# Patient Record
Sex: Female | Born: 1965
Health system: Southern US, Community
[De-identification: ages and names within clinical notes are randomized; demographics above are authoritative.]

## PROBLEM LIST (undated history)

## (undated) DIAGNOSIS — E559 Vitamin D deficiency, unspecified: Secondary | ICD-10-CM

## (undated) DIAGNOSIS — J45909 Unspecified asthma, uncomplicated: Secondary | ICD-10-CM

## (undated) DIAGNOSIS — T753XXA Motion sickness, initial encounter: Secondary | ICD-10-CM

## (undated) DIAGNOSIS — R062 Wheezing: Secondary | ICD-10-CM

## (undated) DIAGNOSIS — R51 Headache: Secondary | ICD-10-CM

## (undated) DIAGNOSIS — E2839 Other primary ovarian failure: Secondary | ICD-10-CM

## (undated) DIAGNOSIS — R519 Headache, unspecified: Secondary | ICD-10-CM

## (undated) DIAGNOSIS — E782 Mixed hyperlipidemia: Secondary | ICD-10-CM

## (undated) DIAGNOSIS — I10 Essential (primary) hypertension: Secondary | ICD-10-CM

## (undated) DIAGNOSIS — M1990 Unspecified osteoarthritis, unspecified site: Secondary | ICD-10-CM

## (undated) DIAGNOSIS — G4710 Hypersomnia, unspecified: Secondary | ICD-10-CM

## (undated) DIAGNOSIS — K5792 Diverticulitis of intestine, part unspecified, without perforation or abscess without bleeding: Secondary | ICD-10-CM

## (undated) DIAGNOSIS — J4530 Mild persistent asthma, uncomplicated: Secondary | ICD-10-CM

## (undated) HISTORY — PX: CHOLECYSTECTOMY: SHX55

## (undated) HISTORY — DX: Unspecified asthma, uncomplicated: J45.909

## (undated) HISTORY — DX: Wheezing: R06.2

## (undated) HISTORY — DX: Essential (primary) hypertension: I10

## (undated) HISTORY — DX: Mixed hyperlipidemia: E78.2

## (undated) HISTORY — DX: Hypersomnia, unspecified: G47.10

## (undated) HISTORY — DX: Other primary ovarian failure: E28.39

## (undated) HISTORY — DX: Mild persistent asthma, uncomplicated: J45.30

## (undated) HISTORY — DX: Diverticulitis of intestine, part unspecified, without perforation or abscess without bleeding: K57.92

## (undated) HISTORY — DX: Vitamin D deficiency, unspecified: E55.9

## (undated) HISTORY — PX: ABDOMINAL HYSTERECTOMY: SHX81

---

## 2004-07-28 ENCOUNTER — Ambulatory Visit: Payer: Self-pay | Admitting: Family Medicine

## 2005-12-28 ENCOUNTER — Ambulatory Visit: Payer: Self-pay | Admitting: Family Medicine

## 2006-04-17 ENCOUNTER — Ambulatory Visit: Payer: Self-pay | Admitting: Unknown Physician Specialty

## 2006-09-12 ENCOUNTER — Ambulatory Visit: Payer: Self-pay

## 2007-05-18 ENCOUNTER — Emergency Department: Payer: Self-pay | Admitting: Internal Medicine

## 2007-07-01 ENCOUNTER — Ambulatory Visit: Payer: Self-pay | Admitting: Neurosurgery

## 2007-10-01 ENCOUNTER — Ambulatory Visit: Payer: Self-pay | Admitting: Internal Medicine

## 2008-10-01 ENCOUNTER — Ambulatory Visit: Payer: Self-pay | Admitting: Internal Medicine

## 2009-07-14 ENCOUNTER — Ambulatory Visit: Payer: Self-pay | Admitting: Internal Medicine

## 2009-10-05 ENCOUNTER — Ambulatory Visit: Payer: Self-pay | Admitting: Internal Medicine

## 2010-10-06 ENCOUNTER — Ambulatory Visit: Payer: Self-pay

## 2011-10-06 ENCOUNTER — Ambulatory Visit: Payer: Self-pay | Admitting: Internal Medicine

## 2011-10-30 ENCOUNTER — Ambulatory Visit: Payer: Self-pay

## 2011-12-24 ENCOUNTER — Emergency Department: Payer: Self-pay | Admitting: Emergency Medicine

## 2011-12-24 LAB — BASIC METABOLIC PANEL
Anion Gap: 13 (ref 7–16)
BUN: 8 mg/dL (ref 7–18)
Chloride: 103 mmol/L (ref 98–107)
Creatinine: 0.58 mg/dL — ABNORMAL LOW (ref 0.60–1.30)
EGFR (African American): >60
EGFR (Non-African Amer.): >60
Glucose: 86 mg/dL (ref 65–99)
Osmolality: 281 (ref 275–301)
Potassium: 3.6 mmol/L (ref 3.5–5.1)
Sodium: 142 mmol/L (ref 136–145)

## 2011-12-24 LAB — CBC
HCT: 39.5 % (ref 35.0–47.0)
HGB: 13.2 g/dL (ref 12.0–16.0)
MCH: 30.1 pg (ref 26.0–34.0)
MCHC: 33.3 g/dL (ref 32.0–36.0)
Platelet: 345 10*3/uL (ref 150–440)
RDW: 13.9 % (ref 11.5–14.5)

## 2011-12-24 LAB — TROPONIN I: Troponin-I: <0.02 ng/mL

## 2012-01-03 ENCOUNTER — Ambulatory Visit: Payer: Self-pay | Admitting: Internal Medicine

## 2012-01-04 ENCOUNTER — Ambulatory Visit: Payer: Self-pay | Admitting: Internal Medicine

## 2012-05-25 ENCOUNTER — Ambulatory Visit: Payer: Self-pay | Admitting: Internal Medicine

## 2012-10-25 ENCOUNTER — Ambulatory Visit: Payer: Self-pay | Admitting: Physician Assistant

## 2012-10-30 ENCOUNTER — Ambulatory Visit: Payer: Self-pay

## 2012-11-04 ENCOUNTER — Ambulatory Visit: Payer: Self-pay | Admitting: Internal Medicine

## 2013-10-31 ENCOUNTER — Ambulatory Visit: Payer: Self-pay

## 2013-11-07 ENCOUNTER — Ambulatory Visit: Payer: Self-pay

## 2014-04-13 ENCOUNTER — Ambulatory Visit: Payer: Self-pay

## 2014-05-08 ENCOUNTER — Ambulatory Visit: Payer: Self-pay

## 2014-11-05 ENCOUNTER — Ambulatory Visit: Payer: Self-pay

## 2015-11-19 ENCOUNTER — Other Ambulatory Visit: Payer: Self-pay

## 2015-11-19 ENCOUNTER — Telehealth: Payer: Self-pay

## 2015-11-19 ENCOUNTER — Telehealth: Payer: Self-pay | Admitting: Gastroenterology

## 2015-11-19 NOTE — Telephone Encounter (Signed)
LVM for pt to return my call.

## 2015-11-19 NOTE — Telephone Encounter (Signed)
colonoscopy

## 2015-11-19 NOTE — Telephone Encounter (Signed)
Pt scheduled for screening colonoscopy at [facility] on 12/10/15. Instructs/rx mailed. Pt has [employer]. Please get prior auth.

## 2015-11-19 NOTE — Telephone Encounter (Signed)
Gastroenterology Pre-Procedure Review  Request Date: 12/10/15 Requesting Physician: Dr. Beverely Risen   PATIENT REVIEW QUESTIONS: The patient responded to the following health history questions as indicated:    1. Are you having any GI issues? no 2. Do you have a personal history of Polyps? no 3. Do you have a family history of Colon Cancer or Polyps? no 4. Diabetes Mellitus? no 5. Joint replacements in the past 12 months?no 6. Major health problems in the past 3 months?no 7. Any artificial heart valves, MVP, or defibrillator?no    MEDICATIONS & ALLERGIES:    Patient reports the following regarding taking any anticoagulation/antiplatelet therapy:   Plavix, Coumadin, Eliquis, Xarelto, Lovenox, Pradaxa, Brilinta, or Effient? no Aspirin? yes (ASA )  Patient confirms/reports the following medications:  No current outpatient prescriptions on file.   No current facility-administered medications for this visit.    Patient confirms/reports the following allergies:  Allergies not on file  No orders of the defined types were placed in this encounter.    AUTHORIZATION INFORMATION Primary Insurance: 1D#: Group #:  Secondary Insurance: 1D#: Group #:  SCHEDULE INFORMATION: Date: 12/10/15 Time: Location: [facility]

## 2015-12-07 NOTE — Discharge Instructions (Signed)

## 2015-12-10 ENCOUNTER — Ambulatory Visit: Payer: 59 | Admitting: Anesthesiology

## 2015-12-10 ENCOUNTER — Ambulatory Visit
Admission: RE | Admit: 2015-12-10 | Discharge: 2015-12-10 | Disposition: A | Discharge status: Home / Self Care | Payer: 59 | Source: Ambulatory Visit | Attending: Gastroenterology | Admitting: Gastroenterology

## 2015-12-10 ENCOUNTER — Encounter: Payer: Self-pay | Admitting: Anesthesiology

## 2015-12-10 ENCOUNTER — Encounter
Admission: RE | Disposition: A | Discharge status: Home / Self Care | Payer: Self-pay | Source: Ambulatory Visit | Attending: Gastroenterology

## 2015-12-10 DIAGNOSIS — M1990 Unspecified osteoarthritis, unspecified site: Secondary | ICD-10-CM | POA: Insufficient documentation

## 2015-12-10 DIAGNOSIS — Z6841 Body Mass Index (BMI) 40.0 and over, adult: Secondary | ICD-10-CM | POA: Insufficient documentation

## 2015-12-10 DIAGNOSIS — Z0001 Encounter for general adult medical examination with abnormal findings: Secondary | ICD-10-CM | POA: Insufficient documentation

## 2015-12-10 DIAGNOSIS — K5730 Diverticulosis of large intestine without perforation or abscess without bleeding: Secondary | ICD-10-CM | POA: Insufficient documentation

## 2015-12-10 DIAGNOSIS — R51 Headache: Secondary | ICD-10-CM | POA: Diagnosis not present

## 2015-12-10 DIAGNOSIS — K640 First degree hemorrhoids: Secondary | ICD-10-CM | POA: Diagnosis not present

## 2015-12-10 DIAGNOSIS — Z79899 Other long term (current) drug therapy: Secondary | ICD-10-CM | POA: Insufficient documentation

## 2015-12-10 DIAGNOSIS — Z1239 Encounter for other screening for malignant neoplasm of breast: Secondary | ICD-10-CM

## 2015-12-10 DIAGNOSIS — Z1211 Encounter for screening for malignant neoplasm of colon: Secondary | ICD-10-CM

## 2015-12-10 DIAGNOSIS — Z7951 Long term (current) use of inhaled steroids: Secondary | ICD-10-CM | POA: Insufficient documentation

## 2015-12-10 DIAGNOSIS — J45909 Unspecified asthma, uncomplicated: Secondary | ICD-10-CM | POA: Insufficient documentation

## 2015-12-10 HISTORY — DX: Motion sickness, initial encounter: T75.3XXA

## 2015-12-10 HISTORY — DX: Unspecified osteoarthritis, unspecified site: M19.90

## 2015-12-10 HISTORY — PX: COLONOSCOPY WITH PROPOFOL: SHX5780

## 2015-12-10 HISTORY — DX: Headache, unspecified: R51.9

## 2015-12-10 HISTORY — DX: Headache: R51

## 2015-12-10 SURGERY — COLONOSCOPY WITH PROPOFOL
Anesthesia: Monitor Anesthesia Care

## 2015-12-10 MED ORDER — OXYCODONE HCL 5 MG/5ML PO SOLN: 5.0000 mg | Freq: Once | ORAL | Status: DC | PRN

## 2015-12-10 MED ORDER — LIDOCAINE HCL (CARDIAC) 20 MG/ML IV SOLN
INTRAVENOUS | Status: DC | PRN
  Administered 2015-12-10: 10 mg via INTRAVENOUS

## 2015-12-10 MED ORDER — OXYCODONE HCL 5 MG PO TABS: 5.0000 mg | ORAL_TABLET | Freq: Once | ORAL | Status: DC | PRN

## 2015-12-10 MED ORDER — STERILE WATER FOR IRRIGATION IR SOLN
Status: DC | PRN
  Administered 2015-12-10: 09:00:00

## 2015-12-10 MED ORDER — PROPOFOL 10 MG/ML IV BOLUS
INTRAVENOUS | Status: DC | PRN
  Administered 2015-12-10 (×8): 20 mg via INTRAVENOUS

## 2015-12-10 MED ORDER — LACTATED RINGERS IV SOLN
INTRAVENOUS | Status: DC
  Administered 2015-12-10: 09:00:00 via INTRAVENOUS

## 2015-12-10 SURGICAL SUPPLY — 28 items

## 2015-12-10 NOTE — Anesthesia Preprocedure Evaluation (Signed)
Anesthesia Evaluation  Patient identified by MRN, date of birth, ID band  Reviewed: NPO status   History of Anesthesia Complications Negative for: history of anesthetic complications  Airway Mallampati: II  TM Distance: >3 FB Neck ROM: full    Dental  (+) Lower Dentures,    Pulmonary asthma ,    Pulmonary exam normal        Cardiovascular Exercise Tolerance: Good negative cardio ROS Normal cardiovascular exam     Neuro/Psych  Headaches, negative psych ROS   GI/Hepatic negative GI ROS, Neg liver ROS,   Endo/Other  Morbid obesity  Renal/GU negative Renal ROS  negative genitourinary   Musculoskeletal  (+) Arthritis ,   Abdominal   Peds  Hematology negative hematology ROS (+)   Anesthesia Other Findings   Reproductive/Obstetrics                             Anesthesia Physical Anesthesia Plan  ASA: II  Anesthesia Plan: MAC   Post-op Pain Management:    Induction:   Airway Management Planned:   Additional Equipment:   Intra-op Plan:   Post-operative Plan:   Informed Consent: I have reviewed the patients History and Physical, chart, labs and discussed the procedure including the risks, benefits and alternatives for the proposed anesthesia with the patient or authorized representative who has indicated his/her understanding and acceptance.     Plan Discussed with: CRNA  Anesthesia Plan Comments:         Anesthesia Quick Evaluation

## 2015-12-10 NOTE — Op Note (Signed)
[facility] Gastroenterology Patient Name: Victoria Castaneda Procedure Date: 12/10/2015 8:48 AM MRN: [id number] Account #: [id number] Date of Birth: [birth year] Admit Type: Outpatient Age: 50 Room: [facility] OR ROOM 01 Gender: Female Note Status: Finalized Procedure:            Colonoscopy Indications:          Screening for colorectal malignant neoplasm Providers:            Midge Minium, MD Referring MD:         Lyndon Code, MD (Referring MD) Medicines:            Propofol per Anesthesia Complications:        No immediate complications. Procedure:            Pre-Anesthesia Assessment:                       - Prior to the procedure, a History and Physical was                        performed, and patient medications and allergies were                        reviewed. The patient's tolerance of previous                        anesthesia was also reviewed. The risks and benefits of                        the procedure and the sedation options and risks were                        discussed with the patient. All questions were                        answered, and informed consent was obtained. Prior                        Anticoagulants: The patient has taken no previous                        anticoagulant or antiplatelet agents. ASA Grade                        Assessment: II - A patient with mild systemic disease.                        After reviewing the risks and benefits, the patient was                        deemed in satisfactory condition to undergo the                        procedure.                       After obtaining informed consent, the colonoscope was                        passed under direct vision. Throughout the procedure,  the patient's blood pressure, pulse, and oxygen                        saturations were monitored continuously. The Olympus                        CF-HQ190L Colonoscope (S#. [id number]) was introduced             through the anus and advanced to the the cecum,                        identified by appendiceal orifice and ileocecal valve.                        The colonoscopy was performed without difficulty. The                        patient tolerated the procedure well. The quality of                        the bowel preparation was excellent. Findings:      The perianal and digital rectal examinations were normal.      Non-bleeding internal hemorrhoids were found during retroflexion. The       hemorrhoids were Grade I (internal hemorrhoids that do not prolapse).      Multiple small-mouthed diverticula were found in the sigmoid colon. Impression:           - Non-bleeding internal hemorrhoids.                       - Diverticulosis in the sigmoid colon.                       - No specimens collected. Recommendation:       - Repeat colonoscopy in 10 years for screening unless                        any change in family history or lower GI problems. Procedure Code(s):    --- Professional ---                       [phone], Colonoscopy, flexible; diagnostic, including                        collection of specimen(s) by brushing or washing, when                        performed (separate procedure) Diagnosis Code(s):    --- Professional ---                       Z12.11, Encounter for screening for malignant neoplasm                        of colon CPT copyright 2016 American Medical Association. All rights reserved. The codes documented in this report are preliminary and upon coder review may  be revised to meet current compliance requirements. Midge Minium, MD 12/10/2015 9:10:17 AM This report has been signed electronically. Number of Addenda: 0 Note Initiated On: 12/10/2015 8:48 AM Scope Withdrawal Time: 0 hours 8 minutes 22 seconds  Total Procedure Duration: 0 hours  11 minutes 29 seconds       [facility]

## 2015-12-10 NOTE — Anesthesia Postprocedure Evaluation (Signed)
Anesthesia Post Note  Patient: Victoria SartoriusSheila Castaneda  Procedure(s) Performed: Procedure(s) (LRB): COLONOSCOPY WITH PROPOFOL (N/A)  Patient location during evaluation: PACU Anesthesia Type: MAC Level of consciousness: awake and alert Pain management: pain level controlled Vital Signs Assessment: post-procedure vital signs reviewed and stable Respiratory status: spontaneous breathing, nonlabored ventilation, respiratory function stable and patient connected to nasal cannula oxygen Cardiovascular status: stable and blood pressure returned to baseline Anesthetic complications: no    Susane Bey

## 2015-12-10 NOTE — H&P (Signed)
  [facility]  [address] [place], [place] [address] Phone: [phone] Fax : [phone]  Primary Care Physician:  Lyndon CodeKHAN, FOZIA M, MD Primary Gastroenterologist:  Dr. Servando SnareWohl  Pre-Procedure History & Physical: HPI:  Victoria SartoriusSheila Castaneda is a 50 y.o. female is here for a screening colonoscopy.   Past Medical History  Diagnosis Date  . Motion sickness   . Headache   . Arthritis     History reviewed. No pertinent past surgical history.  Prior to Admission medications   Medication Sig Start Date End Date Taking? Authorizing Provider  fluticasone (FLONASE) 50 MCG/ACT nasal spray Place into both nostrils daily.    Historical Provider, MD  Fluticasone-Salmeterol (ADVAIR) 250-50 MCG/DOSE AEPB Inhale 1 puff into the lungs 2 (two) times daily.    Historical Provider, MD  furosemide (LASIX) 20 MG tablet Take 20 mg by mouth.    Historical Provider, MD    Allergies as of 11/19/2015  . (No Known Allergies)    History reviewed. No pertinent family history.  Social History   Social History  . Marital Status: Single    Spouse Name: N/A  . Number of Children: N/A  . Years of Education: N/A   Occupational History  . Not on file.   Social History Main Topics  . Smoking status: Never Smoker   . Smokeless tobacco: Not on file  . Alcohol Use: No  . Drug Use: No  . Sexual Activity: Not on file   Other Topics Concern  . Not on file   Social History Narrative  . No narrative on file    Review of Systems: See HPI, otherwise negative ROS  Physical Exam: Ht 5\' 4"  (1.626 m)  Wt 272 lb (123.378 kg)  BMI 46.67 kg/m2 General:   Alert,  pleasant and cooperative in NAD Head:  Normocephalic and atraumatic. Neck:  Supple; no masses or thyromegaly. Lungs:  Clear throughout to auscultation.    Heart:  Regular rate and rhythm. Abdomen:  Soft, nontender and nondistended. Normal bowel sounds, without guarding, and without rebound.   Neurologic:  Alert and  oriented x4;   grossly normal neurologically.  Impression/Plan: Victoria Castaneda is now here to undergo a screening colonoscopy.  Risks, benefits, and alternatives regarding colonoscopy have been reviewed with the patient.  Questions have been answered.  All parties agreeable.

## 2015-12-10 NOTE — Anesthesia Procedure Notes (Signed)
Procedure Name: MAC Performed by: Octavian Godek Pre-anesthesia Checklist: Patient identified, Emergency Drugs available, Suction available, Timeout performed and Patient being monitored Patient Re-evaluated:Patient Re-evaluated prior to inductionOxygen Delivery Method: Nasal cannula Placement Confirmation: positive ETCO2     

## 2015-12-10 NOTE — Transfer of Care (Signed)
Immediate Anesthesia Transfer of Care Note  Patient: Victoria SartoriusSheila Castaneda  Procedure(s) Performed: Procedure(s): COLONOSCOPY WITH PROPOFOL (N/A)  Patient Location: PACU  Anesthesia Type: MAC  Level of Consciousness: awake, alert  and patient cooperative  Airway and Oxygen Therapy: Patient Spontanous Breathing and Patient connected to supplemental oxygen  Post-op Assessment: Post-op Vital signs reviewed, Patient's Cardiovascular Status Stable, Respiratory Function Stable, Patent Airway and No signs of Nausea or vomiting  Post-op Vital Signs: Reviewed and stable  Complications: No apparent anesthesia complications

## 2015-12-13 ENCOUNTER — Encounter: Payer: Self-pay | Admitting: Gastroenterology

## 2016-11-14 DIAGNOSIS — L659 Nonscarring hair loss, unspecified: Secondary | ICD-10-CM | POA: Diagnosis not present

## 2016-11-14 DIAGNOSIS — L638 Other alopecia areata: Secondary | ICD-10-CM | POA: Diagnosis not present

## 2017-01-08 ENCOUNTER — Other Ambulatory Visit: Payer: Self-pay | Admitting: Nurse Practitioner

## 2017-01-08 DIAGNOSIS — Z0001 Encounter for general adult medical examination with abnormal findings: Secondary | ICD-10-CM | POA: Diagnosis not present

## 2017-01-08 DIAGNOSIS — I10 Essential (primary) hypertension: Secondary | ICD-10-CM | POA: Diagnosis not present

## 2017-01-08 DIAGNOSIS — Z1231 Encounter for screening mammogram for malignant neoplasm of breast: Secondary | ICD-10-CM

## 2017-01-08 DIAGNOSIS — N390 Urinary tract infection, site not specified: Secondary | ICD-10-CM | POA: Diagnosis not present

## 2017-02-01 ENCOUNTER — Ambulatory Visit
Admission: RE | Admit: 2017-02-01 | Discharge: 2017-02-01 | Disposition: A | Discharge status: Home / Self Care | Payer: 59 | Source: Ambulatory Visit | Attending: Nurse Practitioner | Admitting: Nurse Practitioner

## 2017-02-01 DIAGNOSIS — Z1231 Encounter for screening mammogram for malignant neoplasm of breast: Secondary | ICD-10-CM | POA: Insufficient documentation

## 2017-02-01 DIAGNOSIS — Z0001 Encounter for general adult medical examination with abnormal findings: Secondary | ICD-10-CM | POA: Diagnosis not present

## 2017-02-01 DIAGNOSIS — I10 Essential (primary) hypertension: Secondary | ICD-10-CM | POA: Diagnosis not present

## 2017-02-01 DIAGNOSIS — E559 Vitamin D deficiency, unspecified: Secondary | ICD-10-CM | POA: Diagnosis not present

## 2017-02-02 ENCOUNTER — Inpatient Hospital Stay
Admission: RE | Admit: 2017-02-02 | Discharge: 2017-02-02 | Disposition: A | Discharge status: Home / Self Care | Payer: Self-pay | Source: Ambulatory Visit | Attending: *Deleted | Admitting: *Deleted

## 2017-02-02 ENCOUNTER — Other Ambulatory Visit: Payer: Self-pay | Admitting: *Deleted

## 2017-02-02 DIAGNOSIS — Z9289 Personal history of other medical treatment: Secondary | ICD-10-CM

## 2017-02-13 DIAGNOSIS — G4710 Hypersomnia, unspecified: Secondary | ICD-10-CM | POA: Diagnosis not present

## 2017-02-13 DIAGNOSIS — J4530 Mild persistent asthma, uncomplicated: Secondary | ICD-10-CM | POA: Diagnosis not present

## 2017-03-16 DIAGNOSIS — N390 Urinary tract infection, site not specified: Secondary | ICD-10-CM | POA: Diagnosis not present

## 2017-03-18 ENCOUNTER — Emergency Department
Admission: EM | Admit: 2017-03-18 | Discharge: 2017-03-18 | Disposition: A | Discharge status: Home / Self Care | Payer: 59 | Attending: Emergency Medicine | Admitting: Emergency Medicine

## 2017-03-18 ENCOUNTER — Emergency Department: Payer: 59

## 2017-03-18 ENCOUNTER — Encounter: Payer: Self-pay | Admitting: Emergency Medicine

## 2017-03-18 DIAGNOSIS — R319 Hematuria, unspecified: Secondary | ICD-10-CM | POA: Diagnosis not present

## 2017-03-18 DIAGNOSIS — N390 Urinary tract infection, site not specified: Secondary | ICD-10-CM | POA: Insufficient documentation

## 2017-03-18 DIAGNOSIS — K449 Diaphragmatic hernia without obstruction or gangrene: Secondary | ICD-10-CM | POA: Diagnosis not present

## 2017-03-18 DIAGNOSIS — J45909 Unspecified asthma, uncomplicated: Secondary | ICD-10-CM | POA: Insufficient documentation

## 2017-03-18 DIAGNOSIS — K5732 Diverticulitis of large intestine without perforation or abscess without bleeding: Secondary | ICD-10-CM

## 2017-03-18 DIAGNOSIS — R1030 Lower abdominal pain, unspecified: Secondary | ICD-10-CM | POA: Diagnosis present

## 2017-03-18 HISTORY — DX: Unspecified asthma, uncomplicated: J45.909

## 2017-03-18 LAB — COMPREHENSIVE METABOLIC PANEL
ALK PHOS: 48 U/L (ref 38–126)
ALT: 11 U/L — ABNORMAL LOW (ref 14–54)
ANION GAP: 8 (ref 5–15)
AST: 14 U/L — ABNORMAL LOW (ref 15–41)
Albumin: 3.7 g/dL (ref 3.5–5.0)
BUN: 7 mg/dL (ref 6–20)
CALCIUM: 9.3 mg/dL (ref 8.9–10.3)
CO2: 30 mmol/L (ref 22–32)
Chloride: 103 mmol/L (ref 101–111)
Creatinine, Ser: 0.67 mg/dL (ref 0.44–1.00)
Glucose, Bld: 106 mg/dL — ABNORMAL HIGH (ref 65–99)
Potassium: 3.3 mmol/L — ABNORMAL LOW (ref 3.5–5.1)
SODIUM: 141 mmol/L (ref 135–145)
Total Bilirubin: 0.5 mg/dL (ref 0.3–1.2)
Total Protein: 8.7 g/dL — ABNORMAL HIGH (ref 6.5–8.1)

## 2017-03-18 LAB — URINALYSIS, COMPLETE (UACMP) WITH MICROSCOPIC
BILIRUBIN URINE: NEGATIVE
GLUCOSE, UA: NEGATIVE mg/dL
KETONES UR: NEGATIVE mg/dL
NITRITE: NEGATIVE
PH: 5 (ref 5.0–8.0)
PROTEIN: 30 mg/dL — AB
Specific Gravity, Urine: 1.02 (ref 1.005–1.030)

## 2017-03-18 LAB — LIPASE, BLOOD: LIPASE: 21 U/L (ref 11–51)

## 2017-03-18 LAB — CBC
HCT: 37.6 % (ref 35.0–47.0)
HEMOGLOBIN: 12.9 g/dL (ref 12.0–16.0)
MCH: 28.9 pg (ref 26.0–34.0)
MCHC: 34.3 g/dL (ref 32.0–36.0)
MCV: 84.4 fL (ref 80.0–100.0)
Platelets: 380 10*3/uL (ref 150–440)
RBC: 4.46 MIL/uL (ref 3.80–5.20)
RDW: 13.4 % (ref 11.5–14.5)
WBC: 13.1 10*3/uL — AB (ref 3.6–11.0)

## 2017-03-18 MED ORDER — CIPROFLOXACIN HCL 500 MG PO TABS: 500.0000 mg | ORAL_TABLET | Freq: Two times a day (BID) | ORAL | 0 refills | Status: DC

## 2017-03-18 MED ORDER — MORPHINE SULFATE (PF) 4 MG/ML IV SOLN
4.0000 mg | Freq: Once | INTRAVENOUS | Status: DC
  Filled 2017-03-18: qty 1

## 2017-03-18 MED ORDER — CIPROFLOXACIN IN D5W 400 MG/200ML IV SOLN
400.0000 mg | Freq: Once | INTRAVENOUS | Status: AC
  Administered 2017-03-18: 400 mg via INTRAVENOUS
  Filled 2017-03-18: qty 200

## 2017-03-18 MED ORDER — ACETAMINOPHEN 500 MG PO TABS
1000.0000 mg | ORAL_TABLET | Freq: Once | ORAL | Status: AC
  Administered 2017-03-18: 1000 mg via ORAL

## 2017-03-18 MED ORDER — METRONIDAZOLE 500 MG PO TABS: 500.0000 mg | ORAL_TABLET | Freq: Two times a day (BID) | ORAL | 0 refills | Status: DC

## 2017-03-18 MED ORDER — IOPAMIDOL (ISOVUE-300) INJECTION 61%
125.0000 mL | Freq: Once | INTRAVENOUS | Status: AC | PRN
  Administered 2017-03-18: 125 mL via INTRAVENOUS

## 2017-03-18 MED ORDER — ACETAMINOPHEN 500 MG PO TABS
ORAL_TABLET | ORAL | Status: AC
  Administered 2017-03-18: 1000 mg via ORAL
  Filled 2017-03-18: qty 2

## 2017-03-18 MED ORDER — IOPAMIDOL (ISOVUE-300) INJECTION 61%
30.0000 mL | Freq: Once | INTRAVENOUS | Status: AC
  Administered 2017-03-18: 30 mL via ORAL

## 2017-03-18 MED ORDER — METRONIDAZOLE IN NACL 5-0.79 MG/ML-% IV SOLN
500.0000 mg | Freq: Once | INTRAVENOUS | Status: AC
  Administered 2017-03-18: 500 mg via INTRAVENOUS
  Filled 2017-03-18: qty 100

## 2017-03-18 MED ORDER — OXYCODONE-ACETAMINOPHEN 5-325 MG PO TABS: 1.0000 | ORAL_TABLET | ORAL | 0 refills | Status: DC | PRN

## 2017-03-18 MED ORDER — ONDANSETRON HCL 4 MG PO TABS: 4.0000 mg | ORAL_TABLET | Freq: Three times a day (TID) | ORAL | 0 refills | Status: DC | PRN

## 2017-03-18 MED ORDER — POTASSIUM CHLORIDE CRYS ER 20 MEQ PO TBCR
40.0000 meq | EXTENDED_RELEASE_TABLET | Freq: Once | ORAL | Status: AC
Start: 2017-03-18 — End: 2017-03-18
  Administered 2017-03-18: 40 meq via ORAL
  Filled 2017-03-18: qty 2

## 2017-03-18 MED ORDER — ONDANSETRON HCL 4 MG/2ML IJ SOLN
4.0000 mg | Freq: Once | INTRAMUSCULAR | Status: DC
  Filled 2017-03-18: qty 2

## 2017-03-18 NOTE — ED Triage Notes (Signed)
Patient presents to the ED with lower abdominal cramping x 4 days.  Patient reports feeling like she needs to have frequent BM but states, "when I go, it's just like a mucous."  Patient denies diarrhea and vomiting.  Patient is in no obvious distress at this time but states cramping has caused her to have difficulty sleeping and is occasionally so severe it brings her to tears.

## 2017-03-18 NOTE — ED Notes (Signed)
Pt states she does not want morphine would like something less strong like Tylenol.  Dr. Shaune PollackLord in room with another patient will ask when she is available.

## 2017-03-18 NOTE — ED Notes (Signed)
ED Provider at bedside. 

## 2017-03-18 NOTE — ED Notes (Signed)
Updated patient that MD is in with another patient still and waiting for her to return to the nurse's station to ask for an order for tylenol.

## 2017-03-18 NOTE — Discharge Instructions (Signed)
You're being treated for diverticulitis as well as urinary tract infection.  Return to the emergency department immediately for any worsening pain, black or bloody stool, fever, dizziness or passing out, vomiting, or any other symptoms concerning to you.

## 2017-03-18 NOTE — ED Notes (Signed)
Ambulatory to Room 11.  Morrie SheldonAshley RN aware of placement.

## 2017-03-18 NOTE — ED Notes (Signed)
Discussed DC paperwork, pt stable for discharge, IV removed. Discussed follow up procedures and return precautions.  Pt verbalized understanding.  Denies any further needs at this time.

## 2017-03-18 NOTE — ED Notes (Signed)
Patient ambulating to the bathroom, states she is having more pain since drinking the contrast and would like some pain medication.  D/w Dr. Shaune PollackLord, new orders received.

## 2017-03-18 NOTE — ED Provider Notes (Signed)
[facility] Emergency Department Provider Note ____________________________________________   I have reviewed the triage vital signs and the triage nursing note.  HISTORY  Chief Complaint Abdominal Pain   Historian Patient  HPI Victoria Castaneda is a 51 y.o. female with a history of peripheral hysterectomy as well as cholecystectomy, presents with lower abdominal pain for about 4 days now. She was seen 2 days ago on Friday in urgent care and was sinus with a urinary tract infection and started on nitrofurantoin. She's continued to have lower abdominal pain and cramping at times severe. Currently pain is 4 out of a 10. Mild nausea without any vomiting. Formed stools, no diarrhea, but she has had some mucous passing per rectum. Denies black stool or bloody stool.  No chest pain or trouble breathing. No fever. No history of diverticulitis.    Past Medical History:  Diagnosis Date  . Arthritis   . Asthma   . Headache   . Motion sickness     Patient Active Problem List   Diagnosis Date Noted  . Special screening for malignant neoplasms, colon     Past Surgical History:  Procedure Laterality Date  . ABDOMINAL HYSTERECTOMY    . CHOLECYSTECTOMY    . COLONOSCOPY WITH PROPOFOL N/A 12/10/2015   Procedure: COLONOSCOPY WITH PROPOFOL;  Surgeon: Midge Minium, MD;  Location: [facility];  Service: Endoscopy;  Laterality: N/A;    Prior to Admission medications   Medication Sig Start Date End Date Taking? Authorizing Provider  albuterol (PROVENTIL) (2.5 MG/3ML) 0.083% nebulizer solution Take 2.5 mg by nebulization every 6 (six) hours as needed for wheezing or shortness of breath.    [provider]  fluticasone (FLONASE) 50 MCG/ACT nasal spray Place into both nostrils daily.    [provider]  Fluticasone-Salmeterol (ADVAIR) 250-50 MCG/DOSE AEPB Inhale 1 puff into the lungs 2 (two) times daily.    [provider]  furosemide (LASIX) 20 MG  tablet Take 20 mg by mouth.    [provider]    No Known Allergies  Family History  Problem Relation Age of Onset  . Breast cancer Neg Hx     Social History Social History  Substance Use Topics  . Smoking status: Never Smoker  . Smokeless tobacco: Never Used  . Alcohol use Yes     Comment: occasionally    Review of Systems  Constitutional: Negative for fever. Eyes: Negative for visual changes. ENT: Negative for sore throat. Cardiovascular: Negative for chest pain. Respiratory: Negative for shortness of breath. Gastrointestinal: Positive as per history of present illness. Genitourinary: Negative for dysuria. Musculoskeletal: Negative for back pain. Skin: Negative for rash. Neurological: Negative for headache.  ____________________________________________   PHYSICAL EXAM:  VITAL SIGNS: ED Triage Vitals  Enc Vitals Group     BP 03/18/17 0826 (!) 136/94     Pulse Rate 03/18/17 0826 99     Resp 03/18/17 0826 18     Temp 03/18/17 0825 98.8 F (37.1 C)     Temp Source 03/18/17 0825 Oral     SpO2 03/18/17 0826 95 %     Weight 03/18/17 0826 290 lb (131.5 kg)     Height 03/18/17 0826 5' 4.5" (1.638 m)     Head Circumference --      Peak Flow --      Pain Score 03/18/17 0825 5     Pain Loc --      Pain Edu? --      Excl. in  GC? --      Constitutional: Alert and oriented. Well appearing and in no distress. HEENT   Head: Normocephalic and atraumatic.      Eyes: Conjunctivae are normal. Pupils equal and round.       Ears:         Nose: No congestion/rhinnorhea.   Mouth/Throat: Mucous membranes are moist.   Neck: No stridor. Cardiovascular/Chest: Normal rate, regular rhythm.  No murmurs, rubs, or gallops. Respiratory: Normal respiratory effort without tachypnea nor retractions. Breath sounds are clear and equal bilaterally. No wheezes/rales/rhonchi. Gastrointestinal: Soft. No distention, no guarding, no rebound. Obese, moderate tenderness  suprapubic and right lower quadrant up into the mid abdomen.  Genitourinary/rectal:Deferred Musculoskeletal: Nontender with normal range of motion in all extremities. No joint effusions.  No lower extremity tenderness.  No edema. Neurologic:  Normal speech and language. No gross or focal neurologic deficits are appreciated. Skin:  Skin is warm, dry and intact. No rash noted. Psychiatric: Mood and affect are normal. Speech and behavior are normal. Patient exhibits appropriate insight and judgment.   ____________________________________________  LABS (pertinent positives/negatives)  Labs Reviewed  COMPREHENSIVE METABOLIC PANEL - Abnormal; Notable for the following:       Result Value   Potassium 3.3 (*)    Glucose, Bld 106 (*)    Total Protein 8.7 (*)    AST 14 (*)    ALT 11 (*)    All other components within normal limits  CBC - Abnormal; Notable for the following:    WBC 13.1 (*)    All other components within normal limits  URINALYSIS, COMPLETE (UACMP) WITH MICROSCOPIC - Abnormal; Notable for the following:    Color, Urine AMBER (*)    APPearance HAZY (*)    Hgb urine dipstick SMALL (*)    Protein, ur 30 (*)    Leukocytes, UA LARGE (*)    Bacteria, UA RARE (*)    Squamous Epithelial / LPF 6-30 (*)    All other components within normal limits  URINE CULTURE  LIPASE, BLOOD    ____________________________________________    EKG I, Governor Rooks, MD, the attending physician have personally viewed and interpreted all ECGs.  None ____________________________________________  RADIOLOGY All Xrays were viewed by me. Imaging interpreted by Radiologist.  CT abdomen and pelvis with contrast:  IMPRESSION: The study is positive for diverticulitis with a small phlegmon or abscess within the wall of the sigmoid colon. No drainable collection is present.  Very small hiatal hernia. Small fat containing umbilical hernia also noted.  Status post cholecystectomy and  hysterectomy. __________________________________________  PROCEDURES  Procedure(s) performed: None  Critical Care performed: None  ____________________________________________   ED COURSE / ASSESSMENT AND PLAN  Pertinent labs & imaging results that were available during my care of the patient were reviewed by me and considered in my medical decision making (see chart for details).   Ms. Doffing is here for ongoing lower abdominal cramping despite diagnosis of urinary tract infection today and a half ago. On laboratory studies the white blood count is elevated and the urine still looks likely consistent with urinary tract infection although there is possibly some skin cells/contamination. I will send a culture. Given a 24-hour nonimprovement, I will plan on changing antibiotics. Additionally however given the patient and the abdomen, I am recommending CT scan for further evaluation.   CT scan shows sigmoid diverticulitis with small phlegmon versus abscess. Patient has not required any narcotic medications here. She is not having ongoing nausea or vomiting.  I discussed with her option of hospital observation versus IV medications here followed by discharge home and close follow-up. Patient would like to go home and I think this is reasonable. No tachycardia or hypotension or fever or issues with symptomatic control here in the emergency department.    CONSULTATIONS:   Discussed with Dr. Earlene Plateravis, general surgery - OK for patient to be treated at home given clinical scenario - recommend streaming for 2 weeks status post 10 days.   Patient / Family / Caregiver informed of clinical course, medical decision-making process, and agree with plan.   I discussed return precautions, follow-up instructions, and discharge instructions with patient and/or family.  Discharge Instructions : Dineen KidYou're being treated for diverticulitis as well as urinary tract infection.  Return to the emergency department  immediately for any worsening pain, black or bloody stool, fever, dizziness or passing out, vomiting, or any other symptoms concerning to you.  ___________________________________________   FINAL CLINICAL IMPRESSION(S) / ED DIAGNOSES   Final diagnoses:  Sigmoid diverticulitis  Urinary tract infection with hematuria, site unspecified              Note: This dictation was prepared with Dragon dictation. Any transcriptional errors that result from this process are unintentional    Governor RooksLord, Mona Ayars, MD 03/18/17 1331

## 2017-03-20 LAB — URINE CULTURE

## 2017-03-23 LAB — CULTURE, BLOOD (ROUTINE X 2)
Culture: NO GROWTH
Culture: NO GROWTH
SPECIAL REQUESTS: ADEQUATE
Special Requests: ADEQUATE

## 2017-05-29 DIAGNOSIS — J4530 Mild persistent asthma, uncomplicated: Secondary | ICD-10-CM | POA: Diagnosis not present

## 2017-05-29 DIAGNOSIS — I10 Essential (primary) hypertension: Secondary | ICD-10-CM | POA: Diagnosis not present

## 2017-06-09 DIAGNOSIS — K5792 Diverticulitis of intestine, part unspecified, without perforation or abscess without bleeding: Secondary | ICD-10-CM

## 2017-06-09 HISTORY — DX: Diverticulitis of intestine, part unspecified, without perforation or abscess without bleeding: K57.92

## 2017-08-23 DIAGNOSIS — Z23 Encounter for immunization: Secondary | ICD-10-CM | POA: Diagnosis not present

## 2017-09-26 ENCOUNTER — Other Ambulatory Visit: Payer: Self-pay | Admitting: Nurse Practitioner

## 2017-09-26 MED ORDER — FUROSEMIDE 20 MG PO TABS: 20.0000 mg | ORAL_TABLET | Freq: Every day | ORAL | 6 refills | Status: DC

## 2017-10-17 DIAGNOSIS — M545 Low back pain, unspecified: Secondary | ICD-10-CM | POA: Insufficient documentation

## 2017-10-17 DIAGNOSIS — Z6841 Body Mass Index (BMI) 40.0 and over, adult: Secondary | ICD-10-CM | POA: Insufficient documentation

## 2017-10-17 DIAGNOSIS — R0602 Shortness of breath: Secondary | ICD-10-CM | POA: Insufficient documentation

## 2017-10-17 DIAGNOSIS — N390 Urinary tract infection, site not specified: Secondary | ICD-10-CM | POA: Insufficient documentation

## 2017-10-17 DIAGNOSIS — J4531 Mild persistent asthma with (acute) exacerbation: Secondary | ICD-10-CM | POA: Insufficient documentation

## 2017-10-17 DIAGNOSIS — E2839 Other primary ovarian failure: Secondary | ICD-10-CM | POA: Insufficient documentation

## 2017-10-17 DIAGNOSIS — E782 Mixed hyperlipidemia: Secondary | ICD-10-CM | POA: Insufficient documentation

## 2017-10-17 DIAGNOSIS — K219 Gastro-esophageal reflux disease without esophagitis: Secondary | ICD-10-CM | POA: Insufficient documentation

## 2017-10-17 DIAGNOSIS — J4530 Mild persistent asthma, uncomplicated: Secondary | ICD-10-CM | POA: Insufficient documentation

## 2017-10-17 DIAGNOSIS — E559 Vitamin D deficiency, unspecified: Secondary | ICD-10-CM | POA: Insufficient documentation

## 2017-10-17 DIAGNOSIS — H9203 Otalgia, bilateral: Secondary | ICD-10-CM | POA: Insufficient documentation

## 2017-10-17 DIAGNOSIS — I10 Essential (primary) hypertension: Secondary | ICD-10-CM | POA: Insufficient documentation

## 2017-10-17 DIAGNOSIS — M79604 Pain in right leg: Secondary | ICD-10-CM | POA: Insufficient documentation

## 2017-10-17 DIAGNOSIS — K921 Melena: Secondary | ICD-10-CM | POA: Insufficient documentation

## 2017-10-17 DIAGNOSIS — G4710 Hypersomnia, unspecified: Secondary | ICD-10-CM | POA: Insufficient documentation

## 2017-10-17 DIAGNOSIS — N958 Other specified menopausal and perimenopausal disorders: Secondary | ICD-10-CM | POA: Insufficient documentation

## 2017-10-18 ENCOUNTER — Ambulatory Visit: Payer: 59 | Admitting: Internal Medicine

## 2017-10-18 ENCOUNTER — Other Ambulatory Visit: Payer: Self-pay

## 2017-10-18 ENCOUNTER — Encounter: Payer: Self-pay | Admitting: Internal Medicine

## 2017-10-18 VITALS — BP 170/86 | HR 84 | Temp 98.5°F | Resp 16 | Ht 64.0 in | Wt 290.0 lb

## 2017-10-18 DIAGNOSIS — G4710 Hypersomnia, unspecified: Secondary | ICD-10-CM | POA: Diagnosis not present

## 2017-10-18 DIAGNOSIS — K219 Gastro-esophageal reflux disease without esophagitis: Secondary | ICD-10-CM | POA: Diagnosis not present

## 2017-10-18 DIAGNOSIS — J45909 Unspecified asthma, uncomplicated: Secondary | ICD-10-CM

## 2017-10-18 DIAGNOSIS — R0602 Shortness of breath: Secondary | ICD-10-CM

## 2017-10-18 MED ORDER — IPRATROPIUM-ALBUTEROL 0.5-2.5 (3) MG/3ML IN SOLN
3.0000 mL | Freq: Once | RESPIRATORY_TRACT | Status: AC
  Administered 2017-10-18: 3 mL via RESPIRATORY_TRACT

## 2017-10-18 MED ORDER — AZITHROMYCIN 250 MG PO TABS: ORAL_TABLET | ORAL | 0 refills | Status: DC

## 2017-10-18 MED ORDER — PREDNISONE 10 MG (21) PO TBPK: ORAL_TABLET | ORAL | 0 refills | Status: DC

## 2017-10-18 NOTE — Progress Notes (Signed)
[facility] [address] [place], [place] [address]  Pulmonary Sleep Medicine  Office Visit Note  Patient Name: Victoria Castaneda DOB: 1965-11-14 MRN [id number]  Date of Service: 10/18/2017     Complaints/HPI:  Patient states that she has been not having any easy time breathing.  She has noted increasing shortness pressures been having wheezing.  Patient is also noted no cough and congestion.  She has tightness in her chest.  Came into office today she was given respiratory treatment and this seemed to help her very significantly.  She has not had any recent admissions for pneumonia.  She has not had any flare ups of her asthma frequently.  She has felt warm and possibly having a fever.  Current Medication: Outpatient Encounter Medications as of 10/18/2017  Medication Sig  . fluticasone (FLONASE) 50 MCG/ACT nasal spray Place into both nostrils daily.  . Fluticasone-Salmeterol (ADVAIR) 250-50 MCG/DOSE AEPB Inhale 1 puff into the lungs 2 (two) times daily.  . furosemide (LASIX) 20 MG tablet Take 1 tablet (20 mg total) by mouth daily.  Marland Kitchen PROAIR HFA 108 (90 Base) MCG/ACT inhaler   . albuterol (PROVENTIL) (2.5 MG/3ML) 0.083% nebulizer solution Take 2.5 mg by nebulization every 6 (six) hours as needed for wheezing or shortness of breath.  . ciprofloxacin (CIPRO) 500 MG tablet Take 1 tablet (500 mg total) by mouth 2 (two) times daily. (Patient not taking: Reported on 10/18/2017)  . metroNIDAZOLE (FLAGYL) 500 MG tablet Take 1 tablet (500 mg total) by mouth 2 (two) times daily. (Patient not taking: Reported on 10/18/2017)  . ondansetron (ZOFRAN) 4 MG tablet Take 1 tablet (4 mg total) by mouth every 8 (eight) hours as needed for nausea or vomiting. (Patient not taking: Reported on 10/18/2017)  . oxyCODONE-acetaminophen (ROXICET) 5-325 MG tablet Take 1 tablet by mouth every 4 (four) hours as needed for severe pain. (Patient not taking: Reported on 10/18/2017)   No facility-administered encounter  medications on file as of 10/18/2017.     Surgical History: Past Surgical History:  Procedure Laterality Date  . ABDOMINAL HYSTERECTOMY    . CHOLECYSTECTOMY    . COLONOSCOPY WITH PROPOFOL N/A 12/10/2015   Procedure: COLONOSCOPY WITH PROPOFOL;  Surgeon: Midge Minium, MD;  Location[facility];  Service: Endoscopy;  Laterality: N/A;    Medical History: Past Medical History:  Diagnosis Date  . Arthritis   . Asthma   . Diverticulitis 06/2017  . Headache   . Hypersomnia, unspecified   . Hypertension, essential   . Mild persistent asthma   . Mixed hyperlipidemia   . Motion sickness   . Primary ovarian failure   . Uncomplicated asthma   . Vitamin D deficiency, unspecified   . Wheezing     Family History: Family History  Problem Relation Age of Onset  . Diabetes Mother   . Breast cancer Neg Hx     Social History: Social History   Socioeconomic History  . Marital status: Single    Spouse name: Not on file  . Number of children: Not on file  . Years of education: Not on file  . Highest education level: Not on file  Social Needs  . Financial resource strain: Not on file  . Food insecurity - worry: Not on file  . Food insecurity - inability: Not on file  . Transportation needs - medical: Not on file  . Transportation needs - non-medical: Not on file  Occupational History  . Not on file  Tobacco Use  .  Smoking status: Never Smoker  . Smokeless tobacco: Never Used  Substance and Sexual Activity  . Alcohol use: Yes    Comment: occasionally  . Drug use: No  . Sexual activity: Not on file  Other Topics Concern  . Not on file  Social History Narrative  . Not on file     ROS  General: (-) fever, (-) chills, (-) night sweats, (-) weakness, (-) changes in appetite. Skin: (-) rashes, (-) itching,. Eyes: (-) visual changes, (-) redness, (-) itching, (-) double or blurred vision. Nose and Sinuses: (-) nasal stuffiness or itchiness, (-) postnasal drip, (-)  nosebleeds, (-) sinus trouble. Mouth and Throat: (-) sore throat, (-) hoarseness. Neck: (-) swollen glands, (-) enlarged thyroid, (-) neck pain. Respiratory: (-) cough, (-) bloody sputum, (-) shortness of breath, (-) wheezing. Cardiovascular: (-) ankle swelling, (-) chest pain. Lymphatic: (-) lymph node enlargement, (-) lymph node tenderness. Neurologic: (-) numbness, (-) tingling,(-) dizziness. Psychiatric: (-) anxiety, (-) depression.  Vital Signs: Blood pressure (!) 170/86, pulse 84, temperature 98.5 F (36.9 C), temperature source Oral, resp. rate 16, height 5\' 4"  (1.626 m), weight 290 lb (131.5 kg), SpO2 95 %.  Examination: General Appearance: The patient is well-developed, well-nourished, and in no distress. Skin: Gross inspection of skin demonstrates no evidence of abnormality. Head: Patient's head is normocephalic, no gross deformities. Eyes: no gross deformities noted. ENT: ears appear grossly normal. Nasopharynx appears to be normal. Neck: Supple. No thyromegaly. No LAD. Respiratory: Lungs are clear to auscultation with no adventitious sounds. Cardiovascular: Normal S1 and S2 without murmur or rub. Extremities: No cyanosis. pulses are equal. Neurologic: Alert and oriented. No involuntary movements.  LABS: No results found for this or any previous visit (from the past 2160 hour(s)).  Radiology: Ct Abdomen Pelvis W Contrast  Result Date: 03/18/2017 CLINICAL DATA:  Lower abdominal cramping for 4 days. Elevated white blood cell count. EXAM: CT ABDOMEN AND PELVIS WITH CONTRAST TECHNIQUE: Multidetector CT imaging of the abdomen and pelvis was performed using the standard protocol following bolus administration of intravenous contrast. CONTRAST:  125 ml ISOVUE-300 IOPAMIDOL (ISOVUE-300) INJECTION 61% COMPARISON:  None. FINDINGS: Lower chest: Lung bases are clear. No pleural or pericardial effusion. Heart size is upper normal. Hepatobiliary: The gallbladder has been removed. The  liver and biliary tree are unremarkable. Pancreas: Unremarkable. No pancreatic ductal dilatation or surrounding inflammatory changes. Spleen: Normal in size without focal abnormality. Adrenals/Urinary Tract: Adrenal glands are unremarkable. Kidneys are normal, without renal calculi, focal lesion, or hydronephrosis. Bladder is unremarkable. Stomach/Bowel: The patient has sigmoid diverticulosis. There is thickening of the walls of mid sigmoid colon with surrounding fat stranding consistent with acute diverticulitis. Somewhat ill-defined low attenuating collection within the wall of the sigmoid colon measures 1.4 cm AP x 2.1 cm transverse x 1.2 cm craniocaudal and is compatible with an intramural abscess or phlegmon. No drainable collection is identified. The colon is otherwise unremarkable. Very small hiatal hernia is identified. The stomach is otherwise normal. Small bowel and appendix appear normal. Vascular/Lymphatic: No significant vascular findings are present. No enlarged abdominal or pelvic lymph nodes. Reproductive: Status post hysterectomy. No adnexal masses. Other: Small fat containing umbilical hernia is noted. Musculoskeletal: No fracture or worrisome lesion. Thoracic and lumbar spondylosis noted. IMPRESSION: The study is positive for diverticulitis with a small phlegmon or abscess within the wall of the sigmoid colon. No drainable collection is present. Very small hiatal hernia. Small fat containing umbilical hernia also noted. Status post cholecystectomy and hysterectomy. Electronically Signed  By: Drusilla Kannerhomas  Dalessio M.D.   On: 03/18/2017 11:43    No results found.  No results found.    Assessment and Plan: Patient Active Problem List   Diagnosis Date Noted  . Body mass index 50.0-59.9, adult (HCC) 10/17/2017  . Hypersomnia, unspecified 10/17/2017  . Otalgia, bilateral 10/17/2017  . Right leg pain 10/17/2017  . Essential (primary) hypertension 10/17/2017  . Shortness of breath 10/17/2017   . Mild persistent asthma with exacerbation 10/17/2017  . Mixed hyperlipidemia 10/17/2017  . Melena 10/17/2017  . Other specified menopausal and perimenopausal disorders 10/17/2017  . Low back pain 10/17/2017  . Vitamin D deficiency, unspecified 10/17/2017  . Urinary tract infection, site not specified 10/17/2017  . Gastroesophageal reflux disease 10/17/2017  . Mild persistent asthma, uncomplicated 10/17/2017  . Other primary ovarian failure 10/17/2017  . Special screening for malignant neoplasms, colon     1. Asthma Exacerbation   She is having an acute flare room asthma She was given bronchial treatment with this actually helped her  Overall prescription for prednisone as well as to take her home Follow-up continue the same about 1-2 weeks  2. GERD  this is under control once again we discussed reflux prevention at length  3. Shortness of breath  secondary to her acute exacerbation of asthma She needs to continue with her rescue inhalers and her maintenance inhalers  4. Obesity  once a done status weight loss settings she continue supportive care this time dietary and exercise  5. Hypersomnia  she did not have her sleep study  Once her acute flare of the asthma is improved I have recommended that she go and get a sleep study  She will be scheduled for home study as her preference  General Counseling: I have discussed the findings of the evaluation and examination with Velna HatchetSheila.  I have also discussed any further diagnostic evaluation thatmay be needed or ordered today. Velna HatchetSheila verbalizes understanding of the findings of todays visit. We also reviewed her medications today and discussed drug interactions and side effects including but not limited excessive drowsiness and altered mental states. We also discussed that there is always a risk not just to her but also people around her. she has been encouraged to call the office with any questions or concerns that should arise related to  todays visit.    Time spent: 25min  I have personally obtained a history, examined the patient, evaluated laboratory and imaging results, formulated the assessment and plan and placed orders.    Yevonne PaxSaadat A Khan, MD [facility] Pulmonary and Critical Care Sleep medicine

## 2017-10-18 NOTE — Patient Instructions (Signed)

## 2017-10-18 NOTE — Addendum Note (Signed)
Addended by: Loura BackSUTTON, Kaiyla Stahly on: 10/18/2017 03:12 PM   Modules accepted: Orders

## 2017-10-30 ENCOUNTER — Ambulatory Visit: Payer: 59 | Admitting: Nurse Practitioner

## 2017-10-30 VITALS — BP 160/90 | HR 74 | Temp 98.1°F | Resp 16 | Ht 64.0 in | Wt 293.0 lb

## 2017-10-30 DIAGNOSIS — J209 Acute bronchitis, unspecified: Secondary | ICD-10-CM

## 2017-10-30 DIAGNOSIS — I10 Essential (primary) hypertension: Secondary | ICD-10-CM

## 2017-10-30 DIAGNOSIS — R05 Cough: Secondary | ICD-10-CM | POA: Diagnosis not present

## 2017-10-30 DIAGNOSIS — R062 Wheezing: Secondary | ICD-10-CM | POA: Diagnosis not present

## 2017-10-30 DIAGNOSIS — R059 Cough, unspecified: Secondary | ICD-10-CM

## 2017-10-30 DIAGNOSIS — J4531 Mild persistent asthma with (acute) exacerbation: Secondary | ICD-10-CM

## 2017-10-30 MED ORDER — LEVOFLOXACIN 500 MG PO TABS: 500.0000 mg | ORAL_TABLET | Freq: Every day | ORAL | 0 refills | Status: DC

## 2017-10-30 MED ORDER — PREDNISONE 10 MG PO TABS: ORAL_TABLET | ORAL | 0 refills | Status: DC

## 2017-10-30 MED ORDER — PROAIR HFA 108 (90 BASE) MCG/ACT IN AERS: 2.0000 | INHALATION_SPRAY | Freq: Four times a day (QID) | RESPIRATORY_TRACT | 5 refills | Status: DC | PRN

## 2017-10-30 MED ORDER — HYDROCOD POLST-CPM POLST ER 10-8 MG/5ML PO SUER: 5.0000 mL | Freq: Two times a day (BID) | ORAL | 0 refills | Status: DC | PRN

## 2017-10-30 MED ORDER — IPRATROPIUM-ALBUTEROL 0.5-2.5 (3) MG/3ML IN SOLN: 3.0000 mL | Freq: Four times a day (QID) | RESPIRATORY_TRACT | 3 refills | Status: DC | PRN

## 2017-10-30 MED ORDER — IPRATROPIUM-ALBUTEROL 0.5-2.5 (3) MG/3ML IN SOLN
3.0000 mL | Freq: Once | RESPIRATORY_TRACT | Status: AC
  Administered 2017-10-30: 3 mL via RESPIRATORY_TRACT

## 2017-10-30 NOTE — Progress Notes (Addendum)
[facility] [address] [place], [place] [id number]  Internal MEDICINE  Office Visit Note  Patient Name: Victoria SartoriusSheila Castaneda  [id number]  [id number]  Date of Service: 10/30/2017  Chief Complaint  Patient presents with  . Wheezing  . Cough  . Nasal Congestion    URI   This is a recurrent problem. The current episode started 1 to 4 weeks ago. The problem has been unchanged. There has been no fever. Associated symptoms include congestion, coughing, headaches, sinus pain, a sore throat and wheezing. She has tried inhaler use (oral antibiotics and steroid taper) for the symptoms. The treatment provided mild relief.    Pt is here for routine follow up.    Current Medication: Outpatient Encounter Medications as of 10/30/2017  Medication Sig  . albuterol (PROVENTIL) (2.5 MG/3ML) 0.083% nebulizer solution Take 2.5 mg by nebulization every 6 (six) hours as needed for wheezing or shortness of breath.  . fluticasone (FLONASE) 50 MCG/ACT nasal spray Place into both nostrils daily.  . Fluticasone-Salmeterol (ADVAIR) 250-50 MCG/DOSE AEPB Inhale 1 puff into the lungs 2 (two) times daily.  . furosemide (LASIX) 20 MG tablet Take 1 tablet (20 mg total) by mouth daily.  . ondansetron (ZOFRAN) 4 MG tablet Take 1 tablet (4 mg total) by mouth every 8 (eight) hours as needed for nausea or vomiting.  Marland Kitchen. PROAIR HFA 108 (90 Base) MCG/ACT inhaler   . azithromycin (ZITHROMAX) 250 MG tablet As directed (Patient not taking: Reported on 10/30/2017)  . ciprofloxacin (CIPRO) 500 MG tablet Take 1 tablet (500 mg total) by mouth 2 (two) times daily. (Patient not taking: Reported on 10/18/2017)  . metroNIDAZOLE (FLAGYL) 500 MG tablet Take 1 tablet (500 mg total) by mouth 2 (two) times daily. (Patient not taking: Reported on 10/18/2017)  . oxyCODONE-acetaminophen (ROXICET) 5-325 MG tablet Take 1 tablet by mouth every 4 (four) hours as needed for severe pain. (Patient not taking: Reported on 10/18/2017)  . predniSONE  (STERAPRED UNI-PAK 21 TAB) 10 MG (21) TBPK tablet Take 1 pack as directed for 12 days (Patient not taking: Reported on 10/30/2017)  . [EXPIRED] ipratropium-albuterol (DUONEB) 0.5-2.5 (3) MG/3ML nebulizer solution 3 mL    No facility-administered encounter medications on file as of 10/30/2017.     Surgical History: Past Surgical History:  Procedure Laterality Date  . ABDOMINAL HYSTERECTOMY    . CHOLECYSTECTOMY    . COLONOSCOPY WITH PROPOFOL N/A 12/10/2015   Procedure: COLONOSCOPY WITH PROPOFOL;  Surgeon: Midge Miniumarren Wohl, MD;  [facility];  Service: Endoscopy;  Laterality: N/A;    Medical History: Past Medical History:  Diagnosis Date  . Arthritis   . Asthma   . Diverticulitis 06/2017  . Headache   . Hypersomnia, unspecified   . Hypertension, essential   . Mild persistent asthma   . Mixed hyperlipidemia   . Motion sickness   . Primary ovarian failure   . Uncomplicated asthma   . Vitamin D deficiency, unspecified   . Wheezing     Family History: Family History  Problem Relation Age of Onset  . Diabetes Mother   . Breast cancer Neg Hx     Social History   Socioeconomic History  . Marital status: Single    Spouse name: Not on file  . Number of children: Not on file  . Years of education: Not on file  . Highest education level: Not on file  Social Needs  . Financial resource strain: Not on file  . Food insecurity - worry: Not  on file  . Food insecurity - inability: Not on file  . Transportation needs - medical: Not on file  . Transportation needs - non-medical: Not on file  Occupational History  . Not on file  Tobacco Use  . Smoking status: Never Smoker  . Smokeless tobacco: Never Used  Substance and Sexual Activity  . Alcohol use: Yes    Comment: occasionally  . Drug use: No  . Sexual activity: Not on file  Other Topics Concern  . Not on file  Social History Narrative  . Not on file      Review of Systems  Constitutional: Positive for  fatigue. Negative for fever.  HENT: Positive for congestion, sinus pressure, sinus pain, sore throat and voice change.   Respiratory: Positive for cough and wheezing.   Cardiovascular:       Elevated blood pressure.   Musculoskeletal: Positive for arthralgias.  Skin: Negative.   Allergic/Immunologic: Positive for environmental allergies.  Neurological: Positive for headaches.  Psychiatric/Behavioral: Negative for behavioral problems. The patient is not nervous/anxious.    Today's Vitals   10/30/17 1055  BP: (!) 160/90  Pulse: 74  Resp: 16  Temp: 98.1 F (36.7 C)  SpO2: 97%  Weight: 293 lb (132.9 kg)  Height: 5\' 4"  (1.626 m)    Physical Exam  Constitutional: She is oriented to person, place, and time. She appears well-developed and well-nourished. No distress.  HENT:  Head: Normocephalic and atraumatic.  Nose: Rhinorrhea present. Right sinus exhibits frontal sinus tenderness. Left sinus exhibits frontal sinus tenderness.  Mouth/Throat: Posterior oropharyngeal erythema present. No oropharyngeal exudate.  Eyes: EOM are normal. Pupils are equal, round, and reactive to light.  Neck: Normal range of motion. Neck supple. No JVD present. No tracheal deviation present. No thyromegaly present.  Cardiovascular: Regular rhythm and normal heart sounds. Tachycardia present. Exam reveals no gallop and no friction rub.  No murmur heard. Pulmonary/Chest: Effort normal. No respiratory distress. She has wheezes. She has no rales. She exhibits no tenderness.  Harsh, congested cough present.   Abdominal: Soft. Bowel sounds are normal. There is no tenderness.  Musculoskeletal: Normal range of motion.  Lymphadenopathy:    She has cervical adenopathy.  Neurological: She is alert and oriented to person, place, and time. No cranial nerve deficit.  Skin: Skin is warm and dry. She is not diaphoretic.  Psychiatric: She has a normal mood and affect. Her behavior is normal. Judgment and thought content  normal.  Nursing note and vitals reviewed.   Assessment/Plan: 1. Acute bronchitis, unspecified organism Start levofloxacin 500mg  daily for 10 days. Rest. Use OTC medicatin to treat symptoms.   2. Wheezing Breathing treatment given in the office with DuoNeb solution. Mild improvement after treatment.  - ipratropium-albuterol (DUONEB) 0.5-2.5 (3) MG/3ML nebulizer solution 3 mL. Will order nebulizer for patient to use at home.  [facility] HFA 108 (90 Base) MCG/ACT inhaler; Inhale 2 puffs into the lungs every 6 (six) hours as needed for wheezing or shortness of breath.  Dispense: 1 Inhaler; Refill: 5  3. Mild persistent asthma with exacerbation - levofloxacin (LEVAQUIN) 500 MG tablet; Take 1 tablet (500 mg total) by mouth daily.  Dispense: 10 tablet; Refill: 0 - predniSONE (DELTASONE) 10 MG tablet; 12 day dose pack - take by mouth as directed for 12 days  Dispense: 48 tablet; Refill: 0  4. Cough - chlorpheniramine-HYDROcodone (TUSSIONEX PENNKINETIC ER) 10-8 MG/5ML SUER; Take 5 mLs by mouth every 12 (twelve) hours as needed for cough.  Dispense: 115  mL; Refill: 0  5. Essential (primary) hypertension Generally stable. Continue blod pressure medication as prescribed. Will reassess at next visit and adjust meds as indicated.   General Counseling: Victoria Castaneda understanding of the findings of todays visit and agrees with plan of treatment. I have discussed any further diagnostic evaluation that may be needed or ordered today. We also reviewed her medications today. she has been encouraged to call the office with any questions or concerns that should arise related to todays visit.  This patient was seen by Vincent Gros, FNP- C in Collaboration with Dr Lyndon Code as a part of collaborative care agreement   Meds ordered this encounter  Medications  . ipratropium-albuterol (DUONEB) 0.5-2.5 (3) MG/3ML nebulizer solution 3 mL    Time spent: 30 Minutes    Dr Lyndon Code Internal  medicine

## 2017-10-31 ENCOUNTER — Encounter: Payer: Self-pay | Admitting: Nurse Practitioner

## 2017-10-31 ENCOUNTER — Telehealth: Payer: Self-pay

## 2017-10-31 NOTE — Telephone Encounter (Signed)
Gave order of nebulizer to [facility] (kelly) today and copy of pres for scan

## 2017-11-27 DIAGNOSIS — J45901 Unspecified asthma with (acute) exacerbation: Secondary | ICD-10-CM | POA: Diagnosis not present

## 2017-12-07 ENCOUNTER — Encounter: Payer: Self-pay | Admitting: Internal Medicine

## 2017-12-25 DIAGNOSIS — J45901 Unspecified asthma with (acute) exacerbation: Secondary | ICD-10-CM | POA: Diagnosis not present

## 2017-12-26 DIAGNOSIS — M25562 Pain in left knee: Secondary | ICD-10-CM | POA: Insufficient documentation

## 2018-01-09 ENCOUNTER — Ambulatory Visit: Payer: 59 | Admitting: Podiatry

## 2018-01-09 ENCOUNTER — Ambulatory Visit (INDEPENDENT_AMBULATORY_CARE_PROVIDER_SITE_OTHER): Payer: 59

## 2018-01-09 ENCOUNTER — Encounter: Payer: Self-pay | Admitting: Podiatry

## 2018-01-09 VITALS — BP 101/65 | HR 85 | Resp 16

## 2018-01-09 DIAGNOSIS — M2011 Hallux valgus (acquired), right foot: Secondary | ICD-10-CM | POA: Diagnosis not present

## 2018-01-09 DIAGNOSIS — M778 Other enthesopathies, not elsewhere classified: Secondary | ICD-10-CM

## 2018-01-09 DIAGNOSIS — M2012 Hallux valgus (acquired), left foot: Secondary | ICD-10-CM | POA: Diagnosis not present

## 2018-01-09 DIAGNOSIS — M779 Enthesopathy, unspecified: Secondary | ICD-10-CM | POA: Diagnosis not present

## 2018-01-09 NOTE — Patient Instructions (Signed)
Pre-Operative Instructions  Congratulations, you have decided to take an important step towards improving your quality of life.  You can be assured that the doctors and staff at [facility] will be with you every step of the way.  Here are some important things you should know:  1. Plan to be at the surgery center/hospital at least 1 (one) hour prior to your scheduled time, unless otherwise directed by the surgical center/hospital staff.  You must have a responsible adult accompany you, remain during the surgery and drive you home.  Make sure you have directions to the surgical center/hospital to ensure you arrive on time. 2. If you are having surgery at [facility] or [facility] hospitals, you will need a copy of your medical history and physical form from your family physician within one month prior to the date of surgery. We will give you a form for your primary physician to complete.  3. We make every effort to accommodate the date you request for surgery.  However, there are times where surgery dates or times have to be moved.  We will contact you as soon as possible if a change in schedule is required.   4. No aspirin/ibuprofen for one week before surgery.  If you are on aspirin, any non-steroidal anti-inflammatory medications (Mobic, Aleve, Ibuprofen) should not be taken seven (7) days prior to your surgery.  You make take Tylenol for pain prior to surgery.  5. Medications - If you are taking daily heart and blood pressure medications, seizure, reflux, allergy, asthma, anxiety, pain or diabetes medications, make sure you notify the surgery center/hospital before the day of surgery so they can tell you which medications you should take or avoid the day of surgery. 6. No food or drink after midnight the night before surgery unless directed otherwise by surgical center/hospital staff. 7. No alcoholic beverages 24-hours prior to surgery.  No smoking 24-hours prior or 24-hours after  surgery. 8. Wear loose pants or shorts. They should be loose enough to fit over bandages, boots, and casts. 9. Don't wear slip-on shoes. Sneakers are preferred. 10. Bring your boot with you to the surgery center/hospital.  Also bring crutches or a walker if your physician has prescribed it for you.  If you do not have this equipment, it will be provided for you after surgery. 11. If you have not been contacted by the surgery center/hospital by the day before your surgery, call to confirm the date and time of your surgery. 12. Leave-time from work may vary depending on the type of surgery you have.  Appropriate arrangements should be made prior to surgery with your employer. 13. Prescriptions will be provided immediately following surgery by your doctor.  Fill these as soon as possible after surgery and take the medication as directed. Pain medications will not be refilled on weekends and must be approved by the doctor. 14. Remove nail polish on the operative foot and avoid getting pedicures prior to surgery. 15. Wash the night before surgery.  The night before surgery wash the foot and leg well with water and the antibacterial soap provided. Be sure to pay special attention to beneath the toenails and in between the toes.  Wash for at least three (3) minutes. Rinse thoroughly with water and dry well with a towel.  Perform this wash unless told not to do so by your physician.  Enclosed: 1 Ice pack (please put in freezer the night before surgery)   1 Hibiclens skin cleaner     Pre-op instructions  If you have any questions regarding the instructions, please do not hesitate to call our office.  [place]: [address], [place], [place] [address] -- [phone]  [place]: [address] [place], [place] [address] -- [phone]  [place]: [address]  [place], [place] [address] -- [phone]  [place]: [address], [address], [place], [place] [address] -- [phone]  Website:  [email or web address] 

## 2018-01-09 NOTE — Progress Notes (Signed)
Subjective:  Patient ID: Victoria Castaneda, female    DOB: Aug 12, 1966,  MRN: [id number] HPI Chief Complaint  Patient presents with  . Foot Pain    1st MPJ bilateral (L>R) - aching x years, getting worse, shoes bothersome, redness and swelling some days  . New Patient (Initial Visit)    53 y.o. female presents with the above complaint.   Review of systems.  Denies fever chills nausea vomiting muscle aches pains chest pain Pain shortness of breath or headaches.  Past Medical History:  Diagnosis Date  . Arthritis   . Asthma   . Diverticulitis 06/2017  . Headache   . Hypersomnia, unspecified   . Hypertension, essential   . Mild persistent asthma   . Mixed hyperlipidemia   . Motion sickness   . Primary ovarian failure   . Uncomplicated asthma   . Vitamin D deficiency, unspecified   . Wheezing    Past Surgical History:  Procedure Laterality Date  . ABDOMINAL HYSTERECTOMY    . CHOLECYSTECTOMY    . COLONOSCOPY WITH PROPOFOL N/A 12/10/2015   Procedure: COLONOSCOPY WITH PROPOFOL;  Surgeon: Lucilla Lame, MD;  Location: [facility];  Service: Endoscopy;  Laterality: N/A;    Current Outpatient Medications:  .  albuterol (PROVENTIL) (2.5 MG/3ML) 0.083% nebulizer solution, Take 2.5 mg by nebulization every 6 (six) hours as needed for wheezing or shortness of breath., Disp: , Rfl:  .  fluticasone (FLONASE) 50 MCG/ACT nasal spray, Place into both nostrils daily., Disp: , Rfl:  .  Fluticasone-Salmeterol (ADVAIR) 250-50 MCG/DOSE AEPB, Inhale 1 puff into the lungs 2 (two) times daily., Disp: , Rfl:  .  furosemide (LASIX) 20 MG tablet, Take 1 tablet (20 mg total) by mouth daily., Disp: 30 tablet, Rfl: 6 .  ipratropium-albuterol (DUONEB) 0.5-2.5 (3) MG/3ML SOLN, Take 3 mLs by nebulization every 6 (six) hours as needed., Disp: 120 mL, Rfl: 3 .  PROAIR HFA 108 (90 Base) MCG/ACT inhaler, Inhale 2 puffs into the lungs every 6 (six) hours as needed for wheezing or shortness of breath., Disp: 1  Inhaler, Rfl: 5  No Known Allergies Review of Systems Objective:   Vitals:   01/09/18 1347  BP: 101/65  Pulse: 85  Resp: 16    General: Well developed, nourished, in no acute distress, alert and oriented x3   Dermatological: Skin is warm, dry and supple bilateral. Nails x 10 are well maintained; remaining integument appears unremarkable at this time. There are no open sores, no preulcerative lesions, no rash or signs of infection present.  Vascular: Dorsalis Pedis artery and Posterior Tibial artery pedal pulses are 2/4 bilateral with immedate capillary fill time. Pedal hair growth present. No varicosities and no lower extremity edema present bilateral.   Neruologic: Grossly intact via light touch bilateral. Vibratory intact via tuning fork bilateral. Protective threshold with Semmes Wienstein monofilament intact to all pedal sites bilateral. Patellar and Achilles deep tendon reflexes 2+ bilateral. No Babinski or clonus noted bilateral.   Musculoskeletal: No gross boney pedal deformities bilateral. No pain, crepitus, or limitation noted with foot and ankle range of motion bilateral. Muscular strength 5/5 in all groups tested bilateral.  Hallux abductovalgus deformity in appearance.  But based on radiographic evaluation it appears that there is a metatarsus adductus component.  He has pain on limited range of motion first metatarsophalangeal bilateral left greater than right prominent hypertrophic medial condyle to the head of the first metatarsal.  Gait: Unassisted, Nonantalgic.    Radiographs:  Radiographs taken today demonstrate  no acute findings.  She does have moderate to severe metatarsus adductus which she has compensated for very nicely with realignment of her digits.  Probably about a 20 degrees met adductus on the right foot lesser on the left foot.  But this has resulted in angular deformity at the first metatarsophalangeal joint.  There is some dorsal spurring and limited range  of motion it appears.  Joint space narrowing to some degree the heads are very irregular to the first metatarsal as are the basis of the proximal phalanges.  Assessment & Plan:   Assessment: Hallux abductovalgus deformity left greater than right.  Plan: Discussed etiology pathology conservative versus surgical therapies.  Due to chronic pain and the lifestyle limiting changes this deformity has made at this point I feel that surgical intervention is necessary.  We consented her for an Baltazar Apo with osteotomy with screw fixation first metatarsal phalangeal joint of the left foot initially.  We will follow-up 2 weeks later for the right foot.  We will also include a hammertoe deformity fifth right.  We discussed the possible postop complications which may include but are not limited to postop pain bleeding swelling infection recurrence need for further surgery overcorrection under correction loss of digit loss of limb loss of life.  She understands this is amenable to it signed all 3 page of the consent form.  We provided her with information regarding [facility] as well as the anesthesia group.  We dispensed a Cam walker and will follow up with her in the near future for surgical intervention  I also injected the joints today first metatarsophalangeal joint bilaterally with dexamethasone and local anesthetic 2 mg of dexamethasone per first metatarsophalangeal joint after sterile Betadine skin prep.  She tolerated procedure well and this should alleviate her symptoms until we are able to perform surgery.     Max T. [place], [place]

## 2018-01-25 DIAGNOSIS — J45901 Unspecified asthma with (acute) exacerbation: Secondary | ICD-10-CM | POA: Diagnosis not present

## 2018-01-28 ENCOUNTER — Ambulatory Visit (INDEPENDENT_AMBULATORY_CARE_PROVIDER_SITE_OTHER): Payer: Managed Care, Other (non HMO) | Admitting: Nurse Practitioner

## 2018-01-28 ENCOUNTER — Encounter: Payer: Self-pay | Admitting: Nurse Practitioner

## 2018-01-28 VITALS — BP 140/90 | HR 81 | Resp 16 | Ht 64.0 in | Wt 293.0 lb

## 2018-01-28 DIAGNOSIS — N390 Urinary tract infection, site not specified: Secondary | ICD-10-CM | POA: Diagnosis not present

## 2018-01-28 DIAGNOSIS — R300 Dysuria: Secondary | ICD-10-CM

## 2018-01-28 DIAGNOSIS — B379 Candidiasis, unspecified: Secondary | ICD-10-CM

## 2018-01-28 DIAGNOSIS — Z1231 Encounter for screening mammogram for malignant neoplasm of breast: Secondary | ICD-10-CM

## 2018-01-28 DIAGNOSIS — I10 Essential (primary) hypertension: Secondary | ICD-10-CM

## 2018-01-28 DIAGNOSIS — Z124 Encounter for screening for malignant neoplasm of cervix: Secondary | ICD-10-CM

## 2018-01-28 DIAGNOSIS — Z1239 Encounter for other screening for malignant neoplasm of breast: Secondary | ICD-10-CM

## 2018-01-28 DIAGNOSIS — Z0001 Encounter for general adult medical examination with abnormal findings: Secondary | ICD-10-CM | POA: Diagnosis not present

## 2018-01-28 LAB — POCT URINALYSIS DIPSTICK
BILIRUBIN UA: NEGATIVE
GLUCOSE UA: NEGATIVE
Ketones, UA: NEGATIVE
Nitrite, UA: NEGATIVE
PH UA: 5 (ref 5.0–8.0)
Protein, UA: NEGATIVE
RBC UA: NEGATIVE
SPEC GRAV UA: 1.01 (ref 1.010–1.025)
UROBILINOGEN UA: 0.2 U/dL

## 2018-01-28 MED ORDER — PHENAZOPYRIDINE HCL 200 MG PO TABS: 200.0000 mg | ORAL_TABLET | Freq: Three times a day (TID) | ORAL | 0 refills | Status: DC | PRN

## 2018-01-28 MED ORDER — AMOXICILLIN-POT CLAVULANATE 875-125 MG PO TABS
1.0000 | ORAL_TABLET | Freq: Two times a day (BID) | ORAL | 0 refills | Status: DC
Start: 2018-01-28 — End: 2018-09-19

## 2018-01-28 NOTE — Progress Notes (Signed)
[facility] [address] [place], [place] [address]  Internal MEDICINE  Office Visit Note  Patient Name: Victoria Castaneda  [id number]  [id number]  Date of Service: 02/13/2018   Pt is here for routine health maintenance examination  Chief Complaint  Patient presents with  . Annual Exam  . Urinary Tract Infection   Urinary Tract Infection   This is a new problem. The current episode started in the past 7 days. The problem occurs intermittently. The problem has been unchanged. The quality of the pain is described as burning. The pain is mild. There has been no fever. She is sexually active. There is no history of pyelonephritis. Associated symptoms include flank pain, frequency and urgency. Pertinent negatives include no chills, nausea or vomiting. She has tried home medications for the symptoms. The treatment provided mild relief. Her past medical history is significant for recurrent UTIs.      Current Medication: Outpatient Encounter Medications as of 01/28/2018  Medication Sig  . albuterol (PROVENTIL) (2.5 MG/3ML) 0.083% nebulizer solution Take 2.5 mg by nebulization every 6 (six) hours as needed for wheezing or shortness of breath.  Marland Kitchen amoxicillin-clavulanate (AUGMENTIN) 875-125 MG tablet Take 1 tablet by mouth 2 (two) times daily.  . fluconazole (DIFLUCAN) 150 MG tablet Take 1 tablet po once. May repeat dose in 3 days as needed for persistent symptoms.  . fluticasone (FLONASE) 50 MCG/ACT nasal spray Place into both nostrils daily.  . Fluticasone-Salmeterol (ADVAIR) 250-50 MCG/DOSE AEPB Inhale 1 puff into the lungs 2 (two) times daily.  . furosemide (LASIX) 20 MG tablet Take 1 tablet (20 mg total) by mouth daily.  Marland Kitchen ipratropium-albuterol (DUONEB) 0.5-2.5 (3) MG/3ML SOLN Take 3 mLs by nebulization every 6 (six) hours as needed.  . phenazopyridine (PYRIDIUM) 200 MG tablet Take 1 tablet (200 mg total) by mouth 3 (three) times daily as needed for pain.  Marland Kitchen PROAIR HFA 108 (90 Base)  MCG/ACT inhaler Inhale 2 puffs into the lungs every 6 (six) hours as needed for wheezing or shortness of breath.   No facility-administered encounter medications on file as of 01/28/2018.     Surgical History: Past Surgical History:  Procedure Laterality Date  . ABDOMINAL HYSTERECTOMY    . CHOLECYSTECTOMY    . COLONOSCOPY WITH PROPOFOL N/A 12/10/2015   Procedure: COLONOSCOPY WITH PROPOFOL;  Surgeon: Midge Minium, MD;  Location: [facility];  Service: Endoscopy;  Laterality: N/A;    Medical History: Past Medical History:  Diagnosis Date  . Arthritis   . Asthma   . Diverticulitis 06/2017  . Headache   . Hypersomnia, unspecified   . Hypertension, essential   . Mild persistent asthma   . Mixed hyperlipidemia   . Motion sickness   . Primary ovarian failure   . Uncomplicated asthma   . Vitamin D deficiency, unspecified   . Wheezing     Family History: Family History  Problem Relation Age of Onset  . Diabetes Mother   . Breast cancer Neg Hx       Review of Systems  Constitutional: Negative for activity change, chills, fatigue and unexpected weight change.  HENT: Negative for congestion, postnasal drip, rhinorrhea, sneezing, sore throat and voice change.   Eyes: Negative.  Negative for redness.  Respiratory: Negative for cough, chest tightness and shortness of breath.   Cardiovascular: Negative for chest pain and palpitations.  Gastrointestinal: Negative for abdominal pain, constipation, diarrhea, nausea and vomiting.  Endocrine: Negative for cold intolerance, heat intolerance, polydipsia, polyphagia and polyuria.  Genitourinary: Positive for flank pain, frequency and urgency. Negative for dysuria.  Musculoskeletal: Positive for back pain. Negative for arthralgias, joint swelling and neck pain.  Skin: Negative for rash.  Allergic/Immunologic: Negative for environmental allergies.  Neurological: Negative for dizziness, tremors, numbness and headaches.  Hematological:  Negative for adenopathy. Does not bruise/bleed easily.  Psychiatric/Behavioral: Negative for behavioral problems (Depression), sleep disturbance and suicidal ideas. The patient is not nervous/anxious.      Vital Signs: BP 140/90   Pulse 81   Resp 16   Ht 5\' 4"  (1.626 m)   Wt 293 lb (132.9 kg)   SpO2 93%   BMI 50.29 kg/m    Physical Exam  Constitutional: She is oriented to person, place, and time. She appears well-developed and well-nourished. No distress.  HENT:  Head: Normocephalic and atraumatic.  Mouth/Throat: Oropharynx is clear and moist. No oropharyngeal exudate.  Eyes: Pupils are equal, round, and reactive to light. EOM are normal.  Neck: Normal range of motion. Neck supple. No JVD present. No tracheal deviation present. No thyromegaly present.  Cardiovascular: Normal rate, regular rhythm, normal heart sounds and intact distal pulses. Exam reveals no gallop and no friction rub.  No murmur heard. Pulmonary/Chest: Effort normal and breath sounds normal. No respiratory distress. She has no wheezes. She has no rales. She exhibits no tenderness. Right breast exhibits no inverted nipple, no mass, no nipple discharge, no skin change and no tenderness. Left breast exhibits no inverted nipple, no mass, no nipple discharge, no skin change and no tenderness.  Abdominal: Soft. Bowel sounds are normal. There is no tenderness.  Genitourinary: Pelvic exam was performed with patient supine. There is no rash, tenderness, lesion or injury on the right labia. There is no rash, tenderness, lesion or injury on the left labia. No erythema, tenderness or bleeding in the vagina. No foreign body in the vagina. No signs of injury around the vagina. No vaginal discharge found.  Genitourinary Comments: Urine sample positive for moderate WBC. There is no tenderness, masses, or organomegaly present during bimanual exam.   Musculoskeletal: Normal range of motion.  Lymphadenopathy:    She has no cervical  adenopathy.  Neurological: She is alert and oriented to person, place, and time. No cranial nerve deficit.  Skin: Skin is warm and dry. She is not diaphoretic.  Psychiatric: She has a normal mood and affect. Her behavior is normal. Judgment and thought content normal.  Nursing note and vitals reviewed.   LABS: Recent Results (from the past 2160 hour(s))  Pap IG and HPV (high risk) DNA detection     Status: None   Collection Time: 01/28/18 11:00 AM  Result Value Ref Range   DIAGNOSIS: Comment     Comment: NEGATIVE FOR INTRAEPITHELIAL LESION OR MALIGNANCY. FUNGAL ORGANISMS MORPHOLOGICALLY CONSISTENT WITH CANDIDA SPECIES ARE PRESENT.    Specimen adequacy: Comment     Comment: Satisfactory for evaluation. Endocervical and/or squamous metaplastic cells (endocervical component) are present.    Clinician Provided ICD10 Comment     Comment: Z12.4   Performed by: Comment     Comment: Nadyne Coombes, Cytotechnologist (ASCP)   PAP Smear Comment .    Note: Comment     Comment: The Pap smear is a screening test designed to aid in the detection of premalignant and malignant conditions of the uterine cervix.  It is not a diagnostic procedure and should not be used as the sole means of detecting cervical cancer.  Both false-positive and false-negative reports do occur.  Test Methodology Comment     Comment: This liquid based ThinPrep(R) pap test was screened with the use of an image guided system.    HPV, high-risk Negative Negative    Comment: This high-risk HPV test detects thirteen high-risk types (16/18/31/33/35/39/45/51/52/56/58/59/68) without differentiation.   CULTURE, URINE COMPREHENSIVE     Status: None   Collection Time: 01/28/18 11:00 AM  Result Value Ref Range   Urine Culture, Comprehensive Final report    Organism ID, Bacteria Comment     Comment: Mixed urogenital flora 25,000-50,000 colony forming units per mL   POCT Urinalysis Dipstick     Status: Abnormal    Collection Time: 01/28/18 12:11 PM  Result Value Ref Range   Color, UA     Clarity, UA     Glucose, UA NEGATIVE    Bilirubin, UA NEGATIVE    Ketones, UA NEGATIVE    Spec Grav, UA 1.010 1.010 - 1.025   Blood, UA NEGATIVE    pH, UA 5.0 5.0 - 8.0   Protein, UA NEGATIVE    Urobilinogen, UA 0.2 0.2 or 1.0 E.U./dL   Nitrite, UA NEGATIVE    Leukocytes, UA Moderate (2+) (A) Negative   Appearance     Odor      Assessment/Plan: 1. Encounter for general adult medical examination with abnormal findings Annual health maintenance exam performed today.  2. Urinary tract infection without hematuria, site unspecified Start augmentin 875mg  BID for 10 days. Adjust abx as indicated based on results of culture and sensitivity - amoxicillin-clavulanate (AUGMENTIN) 875-125 MG tablet; Take 1 tablet by mouth 2 (two) times daily.  Dispense: 20 tablet; Refill: 0 - CULTURE, URINE COMPREHENSIVE  3. Essential (primary) hypertension Generally stable. Continue bp medication as prescribed.   4. Candidiasis Diflucan prescribed in case yeast  Infection develops.  - fluconazole (DIFLUCAN) 150 MG tablet; Take 1 tablet po once. May repeat dose in 3 days as needed for persistent symptoms.  Dispense: 3 tablet; Refill: 0  5. Dysuria - POCT Urinalysis Dipstick - phenazopyridine (PYRIDIUM) 200 MG tablet; Take 1 tablet (200 mg total) by mouth 3 (three) times daily as needed for pain.  Dispense: 10 tablet; Refill: 0  6. Routine cervical smear - Pap IG and HPV (high risk) DNA detection  7. Screening for breast cancer - MM 3D SCREEN BREAST BILATERAL; Future  General Counseling: Vira AgarSheila verbalizes understanding of the findings of todays visit and agrees with plan of treatment. I have discussed any further diagnostic evaluation that may be needed or ordered today. We also reviewed her medications today. she has been encouraged to call the office with any questions or concerns that should arise related to todays  visit.  This patient was seen by Vincent GrosHeather Meli Faley, FNP- C in Collaboration with Dr Lyndon CodeFozia M Khan as a part of collaborative care agreement   Orders Placed This Encounter  Procedures  . CULTURE, URINE COMPREHENSIVE  . MM 3D SCREEN BREAST BILATERAL  . POCT Urinalysis Dipstick    Meds ordered this encounter  Medications  . amoxicillin-clavulanate (AUGMENTIN) 875-125 MG tablet    Sig: Take 1 tablet by mouth 2 (two) times daily.    Dispense:  20 tablet    Refill:  0    Order Specific Question:   Supervising Provider    Answer:   Lyndon CodeKHAN, FOZIA M [[id number]]  . phenazopyridine (PYRIDIUM) 200 MG tablet    Sig: Take 1 tablet (200 mg total) by mouth 3 (three) times daily as needed for pain.  Dispense:  10 tablet    Refill:  0    Order Specific Question:   Supervising Provider    Answer:   Lyndon Code [[id number]]  . fluconazole (DIFLUCAN) 150 MG tablet    Sig: Take 1 tablet po once. May repeat dose in 3 days as needed for persistent symptoms.    Dispense:  3 tablet    Refill:  0    Order Specific Question:   Supervising Provider    Answer:   Lyndon Code [[id number]]    Time spent: 48 Minutes      Lyndon Code, MD  Internal Medicine

## 2018-01-30 LAB — PAP IG AND HPV HIGH-RISK
HPV, high-risk: NEGATIVE
PAP Smear Comment: 0

## 2018-01-31 ENCOUNTER — Telehealth: Payer: Self-pay

## 2018-01-31 LAB — CULTURE, URINE COMPREHENSIVE

## 2018-01-31 MED ORDER — FLUCONAZOLE 150 MG PO TABS: ORAL_TABLET | ORAL | 0 refills | Status: DC

## 2018-01-31 NOTE — Telephone Encounter (Signed)
PT ADVISED PAP SMEAR NORMAL DID SHOWED YEAST INFECTION HEATHER END DIFLUCAN TAKE 1 TODAY REPEAT IN 3 DAYS IF SYMPTOMS PERSIITS

## 2018-02-13 ENCOUNTER — Ambulatory Visit
Admission: RE | Admit: 2018-02-13 | Discharge: 2018-02-13 | Disposition: A | Discharge status: Home / Self Care | Payer: 59 | Source: Ambulatory Visit | Attending: Nurse Practitioner | Admitting: Nurse Practitioner

## 2018-02-13 DIAGNOSIS — R300 Dysuria: Secondary | ICD-10-CM | POA: Insufficient documentation

## 2018-02-13 DIAGNOSIS — Z1231 Encounter for screening mammogram for malignant neoplasm of breast: Secondary | ICD-10-CM | POA: Diagnosis present

## 2018-02-13 DIAGNOSIS — B379 Candidiasis, unspecified: Secondary | ICD-10-CM | POA: Insufficient documentation

## 2018-02-13 DIAGNOSIS — Z1239 Encounter for other screening for malignant neoplasm of breast: Secondary | ICD-10-CM

## 2018-02-24 DIAGNOSIS — J45901 Unspecified asthma with (acute) exacerbation: Secondary | ICD-10-CM | POA: Diagnosis not present

## 2018-03-26 ENCOUNTER — Other Ambulatory Visit: Payer: Self-pay

## 2018-03-26 MED ORDER — FLUTICASONE-SALMETEROL 250-50 MCG/DOSE IN AEPB: 1.0000 | INHALATION_SPRAY | Freq: Two times a day (BID) | RESPIRATORY_TRACT | 3 refills | Status: DC

## 2018-03-27 DIAGNOSIS — J45901 Unspecified asthma with (acute) exacerbation: Secondary | ICD-10-CM | POA: Diagnosis not present

## 2018-04-08 ENCOUNTER — Other Ambulatory Visit: Payer: Self-pay | Admitting: Internal Medicine

## 2018-04-08 MED ORDER — FUROSEMIDE 20 MG PO TABS: 20.0000 mg | ORAL_TABLET | Freq: Every day | ORAL | 6 refills | Status: DC

## 2018-04-26 DIAGNOSIS — J45901 Unspecified asthma with (acute) exacerbation: Secondary | ICD-10-CM | POA: Diagnosis not present

## 2018-05-27 DIAGNOSIS — J45901 Unspecified asthma with (acute) exacerbation: Secondary | ICD-10-CM | POA: Diagnosis not present

## 2018-05-31 ENCOUNTER — Ambulatory Visit: Payer: Managed Care, Other (non HMO) | Admitting: Nurse Practitioner

## 2018-05-31 ENCOUNTER — Encounter: Payer: Self-pay | Admitting: Nurse Practitioner

## 2018-05-31 VITALS — BP 140/93 | HR 84 | Resp 16 | Ht 64.0 in | Wt 303.4 lb

## 2018-05-31 DIAGNOSIS — J4531 Mild persistent asthma with (acute) exacerbation: Secondary | ICD-10-CM | POA: Diagnosis not present

## 2018-05-31 DIAGNOSIS — H6123 Impacted cerumen, bilateral: Secondary | ICD-10-CM

## 2018-05-31 NOTE — Progress Notes (Signed)
[facility] [address] [place], [place] [address]  Internal MEDICINE  Office Visit Note  Patient Name: Victoria Castaneda  [id number]  [id number]  Date of Service: 06/10/2018  Chief Complaint  Patient presents with  . Medical Management of Chronic Issues    weight management  . Cerumen Impaction    both ears have been feeling stopped up recently.     The patient has had a 10 pound weight gain since her last visit. She has been given a form to be filled out by PCP to waive increased premiums for health insurance based on her weight. Today, we need to discuss plan for her to lose weight so that request for reduced premiums is successful.  The patient states that her ears feel stopped up and she has been wheezing a bit more often over the past few weeks. She feels like she needs to have her ears flushed out.       Current Medication: Outpatient Encounter Medications as of 05/31/2018  Medication Sig  . albuterol (PROVENTIL) (2.5 MG/3ML) 0.083% nebulizer solution Take 2.5 mg by nebulization every 6 (six) hours as needed for wheezing or shortness of breath.  . fluticasone (FLONASE) 50 MCG/ACT nasal spray Place into both nostrils daily.  . Fluticasone-Salmeterol (ADVAIR) 250-50 MCG/DOSE AEPB Inhale 1 puff into the lungs 2 (two) times daily.  . furosemide (LASIX) 20 MG tablet Take 1 tablet (20 mg total) by mouth daily.  Marland Kitchen ipratropium-albuterol (DUONEB) 0.5-2.5 (3) MG/3ML SOLN Take 3 mLs by nebulization every 6 (six) hours as needed.  . phenazopyridine (PYRIDIUM) 200 MG tablet Take 1 tablet (200 mg total) by mouth 3 (three) times daily as needed for pain.  Marland Kitchen PROAIR HFA 108 (90 Base) MCG/ACT inhaler Inhale 2 puffs into the lungs every 6 (six) hours as needed for wheezing or shortness of breath.  Marland Kitchen amoxicillin-clavulanate (AUGMENTIN) 875-125 MG tablet Take 1 tablet by mouth 2 (two) times daily. (Patient not taking: Reported on 05/31/2018)  . fluconazole (DIFLUCAN) 150 MG tablet Take 1  tablet po once. May repeat dose in 3 days as needed for persistent symptoms.   No facility-administered encounter medications on file as of 05/31/2018.     Surgical History: Past Surgical History:  Procedure Laterality Date  . ABDOMINAL HYSTERECTOMY    . CHOLECYSTECTOMY    . COLONOSCOPY WITH PROPOFOL N/A 12/10/2015   Procedure: COLONOSCOPY WITH PROPOFOL;  Surgeon: Midge Minium, MD;  Location: [facility];  Service: Endoscopy;  Laterality: N/A;    Medical History: Past Medical History:  Diagnosis Date  . Arthritis   . Asthma   . Diverticulitis 06/2017  . Headache   . Hypersomnia, unspecified   . Hypertension, essential   . Mild persistent asthma   . Mixed hyperlipidemia   . Motion sickness   . Primary ovarian failure   . Uncomplicated asthma   . Vitamin D deficiency, unspecified   . Wheezing     Family History: Family History  Problem Relation Age of Onset  . Diabetes Mother   . Breast cancer Neg Hx     Social History   Socioeconomic History  . Marital status: Single    Spouse name: Not on file  . Number of children: Not on file  . Years of education: Not on file  . Highest education level: Not on file  Occupational History  . Not on file  Social Needs  . Financial resource strain: Not on file  . Food insecurity:    Worry:  Not on file    Inability: Not on file  . Transportation needs:    Medical: Not on file    Non-medical: Not on file  Tobacco Use  . Smoking status: Never Smoker  . Smokeless tobacco: Never Used  Substance and Sexual Activity  . Alcohol use: Yes    Comment: occasionally  . Drug use: No  . Sexual activity: Not on file  Lifestyle  . Physical activity:    Days per week: Not on file    Minutes per session: Not on file  . Stress: Not on file  Relationships  . Social connections:    Talks on phone: Not on file    Gets together: Not on file    Attends religious service: Not on file    Active member of club or organization: Not on  file    Attends meetings of clubs or organizations: Not on file    Relationship status: Not on file  . Intimate partner violence:    Fear of current or ex partner: Not on file    Emotionally abused: Not on file    Physically abused: Not on file    Forced sexual activity: Not on file  Other Topics Concern  . Not on file  Social History Narrative  . Not on file      Review of Systems  Constitutional: Positive for unexpected weight change. Negative for activity change, chills and fatigue.       10 pound weight gain since her last visit.   HENT: Negative for congestion, postnasal drip, rhinorrhea, sneezing, sore throat and voice change.        Both ears feel plugged and are itchy. Feels like she has something stuck in them that she cannot get out.   Eyes: Negative.  Negative for redness.  Respiratory: Negative for cough, chest tightness and shortness of breath.   Cardiovascular: Negative for chest pain and palpitations.  Gastrointestinal: Negative for abdominal pain, constipation, diarrhea, nausea and vomiting.  Endocrine: Negative for cold intolerance, heat intolerance, polydipsia, polyphagia and polyuria.  Genitourinary: Negative for dysuria, flank pain, frequency and urgency.  Musculoskeletal: Positive for back pain. Negative for arthralgias, joint swelling and neck pain.  Skin: Negative for rash.  Allergic/Immunologic: Negative for environmental allergies.  Neurological: Negative for dizziness, tremors, numbness and headaches.  Hematological: Negative for adenopathy. Does not bruise/bleed easily.  Psychiatric/Behavioral: Negative for behavioral problems (Depression), sleep disturbance and suicidal ideas. The patient is not nervous/anxious.     Today's Vitals   05/31/18 1033  BP: (!) 140/93  Pulse: 84  Resp: 16  SpO2: 93%  Weight: (!) 303 lb 6.4 oz (137.6 kg)  Height: 5\' 4"  (1.626 m)    Physical Exam  Constitutional: She is oriented to person, place, and time. She appears  well-developed and well-nourished. No distress.  HENT:  Head: Normocephalic and atraumatic.  Nose: Nose normal.  Mouth/Throat: Oropharynx is clear and moist. No oropharyngeal exudate.  Both ears have moderate amount of cerumen present in the external canals.   Eyes: Pupils are equal, round, and reactive to light. Conjunctivae and EOM are normal.  Neck: Normal range of motion. Neck supple. No JVD present. No tracheal deviation present. No thyromegaly present.  Cardiovascular: Normal rate, regular rhythm and normal heart sounds. Exam reveals no gallop and no friction rub.  No murmur heard. Pulmonary/Chest: Effort normal and breath sounds normal. No respiratory distress. She has no wheezes. She has no rales. She exhibits no tenderness.  Abdominal: Soft.  Bowel sounds are normal. There is no tenderness.  Genitourinary: Pelvic exam was performed with patient supine. There is no rash, tenderness, lesion or injury on the right labia. There is no rash, tenderness, lesion or injury on the left labia. No erythema, tenderness or bleeding in the vagina. No foreign body in the vagina. No signs of injury around the vagina. No vaginal discharge found.  Musculoskeletal: Normal range of motion.  Lymphadenopathy:    She has no cervical adenopathy.  Neurological: She is alert and oriented to person, place, and time. No cranial nerve deficit.  Skin: Skin is warm and dry. She is not diaphoretic.  Psychiatric: She has a normal mood and affect. Her behavior is normal. Judgment and thought content normal.  Nursing note and vitals reviewed.  Assessment/Plan: 1. Bilateral impacted cerumen Bilateral ear wash performed. Patient tolerated the procedure well. Ear canals appear clear after procedure.  - Ear Lavage  2. Mild persistent asthma with exacerbation Continue to use inhaler and respiratory treatments as needed and as prescribed.   3. Morbid obesity (HCC) Obesity Counseling: Risk Assessment: An assessment of  behavioral risk factors was made today and includes lack of exercise sedentary lifestyle, lack of portion control and poor dietary habits. Risk Modification Advice: She was counseled on portion control guidelines. Restricting daily caloric intake to 1500 . The detrimental long term effects of obesity on her health and ongoing poor compliance was also discussed with the patient. Biometric form was completed and returned to the patient.     General Counseling: Victoria Castaneda verbalizes understanding of the findings of todays visit and agrees with plan of treatment. I have discussed any further diagnostic evaluation that may be needed or ordered today. We also reviewed her medications today. she has been encouraged to call the office with any questions or concerns that should arise related to todays visit.   There is a liability release in patients' chart. There has been a 10 minute discussion about the side effects including but not limited to elevated blood pressure, anxiety, lack of sleep and dry mouth. Pt understands and will like to start/continue on appetite suppressant at this time. There will be one month RX given at the time of visit with proper follow up. Nova diet plan with restricted calories is given to the pt. Pt understands and agrees with  plan of treatment  This patient was seen by Vincent GrosHeather Jabron Weese FNP Collaboration with Dr Lyndon CodeFozia M Khan as a part of collaborative care agreement  Orders Placed This Encounter  Procedures  . Ear Lavage      Time spent: 25 Minutes      Dr Lyndon CodeFozia M Khan Internal medicine

## 2018-06-10 DIAGNOSIS — H6123 Impacted cerumen, bilateral: Secondary | ICD-10-CM | POA: Insufficient documentation

## 2018-06-27 DIAGNOSIS — J45901 Unspecified asthma with (acute) exacerbation: Secondary | ICD-10-CM | POA: Diagnosis not present

## 2018-06-28 DIAGNOSIS — Z23 Encounter for immunization: Secondary | ICD-10-CM | POA: Diagnosis not present

## 2018-07-27 DIAGNOSIS — J45901 Unspecified asthma with (acute) exacerbation: Secondary | ICD-10-CM | POA: Diagnosis not present

## 2018-08-22 ENCOUNTER — Other Ambulatory Visit: Payer: Self-pay

## 2018-08-22 IMAGING — MG MM DIGITAL SCREENING BILAT W/ TOMO W/ CAD
6 of 10 series · 6 of 30 positions shown · non-contrast
Comparison: Previous exam(s).

CLINICAL DATA: Screening.

EXAM:
DIGITAL SCREENING BILATERAL MAMMOGRAM WITH TOMO AND CAD

[R MLO synth-2D]
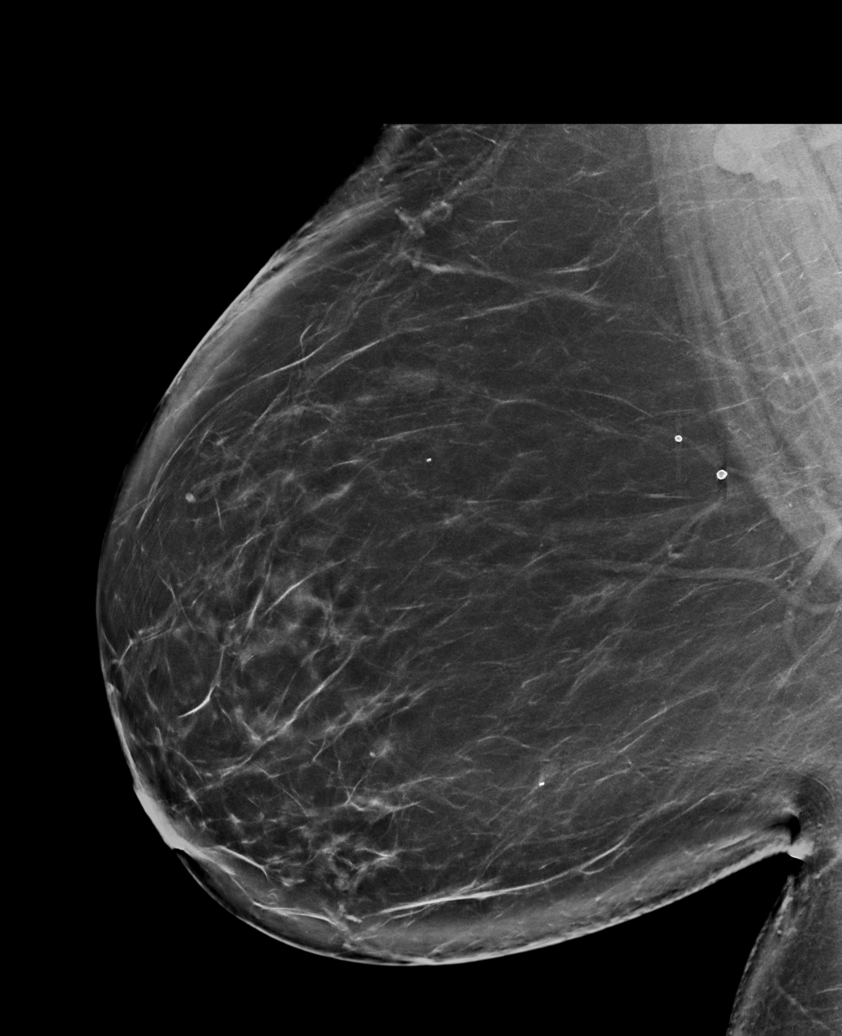

[L MLO synth-2D]
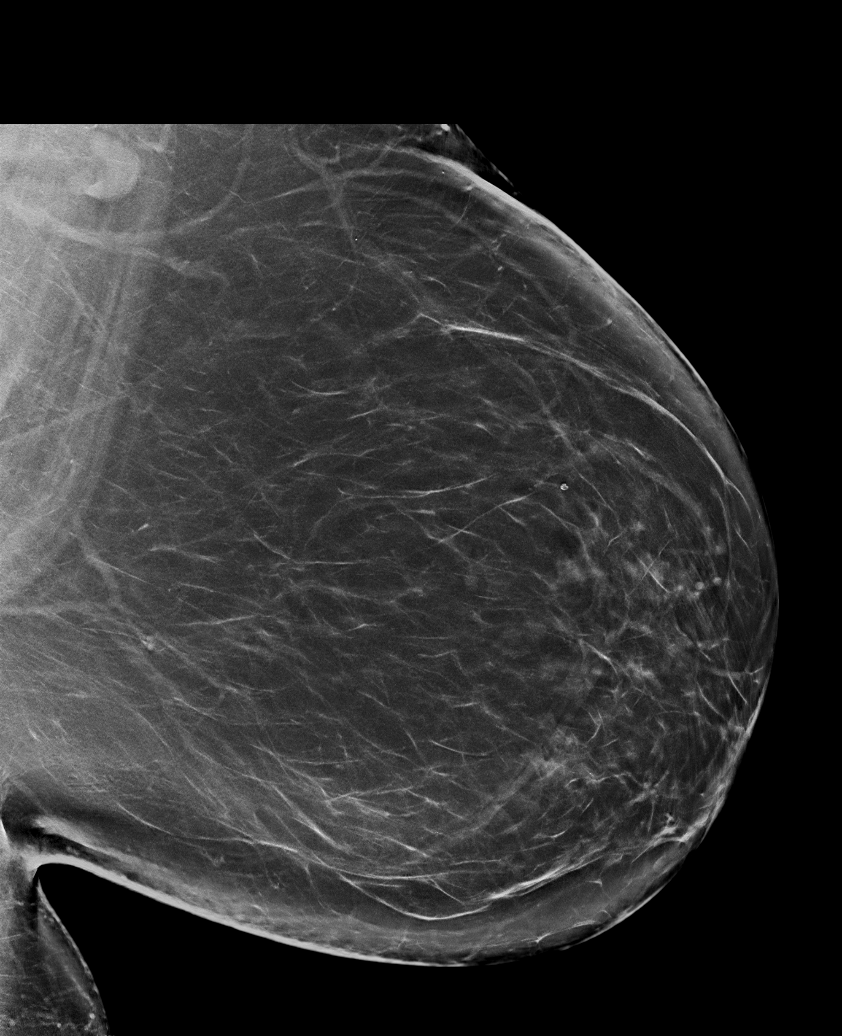

[L CC synth-2D]
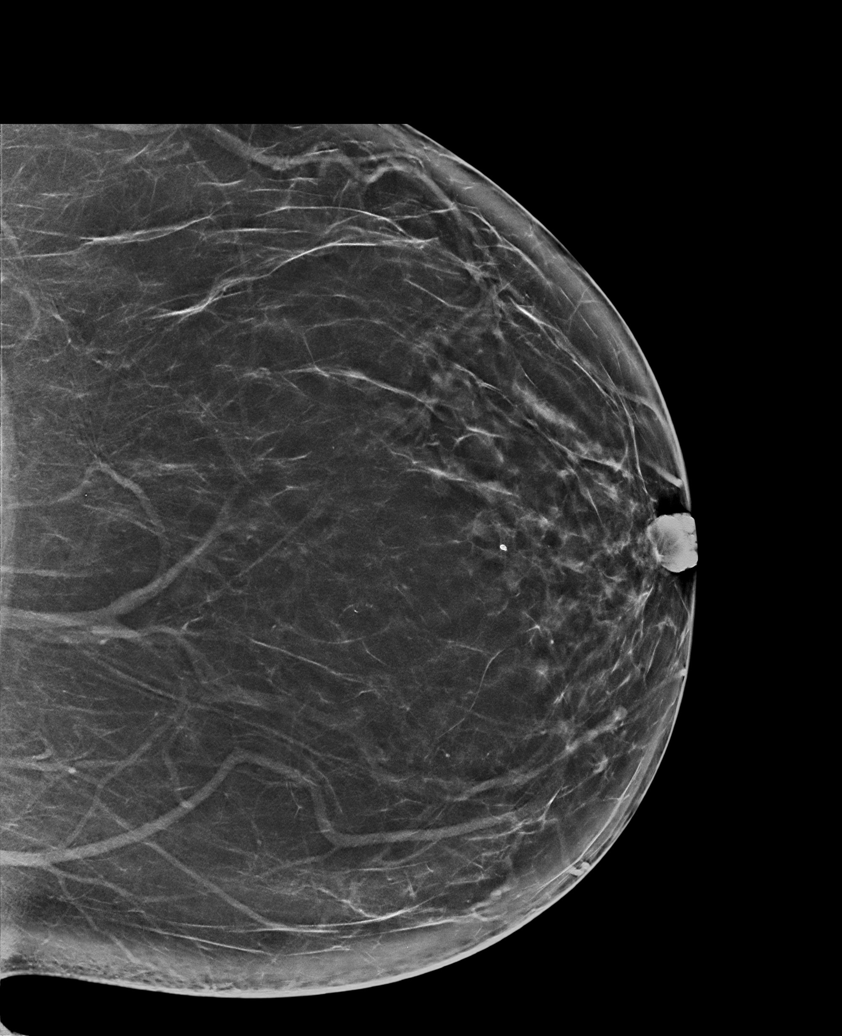

[R CC synth-2D]
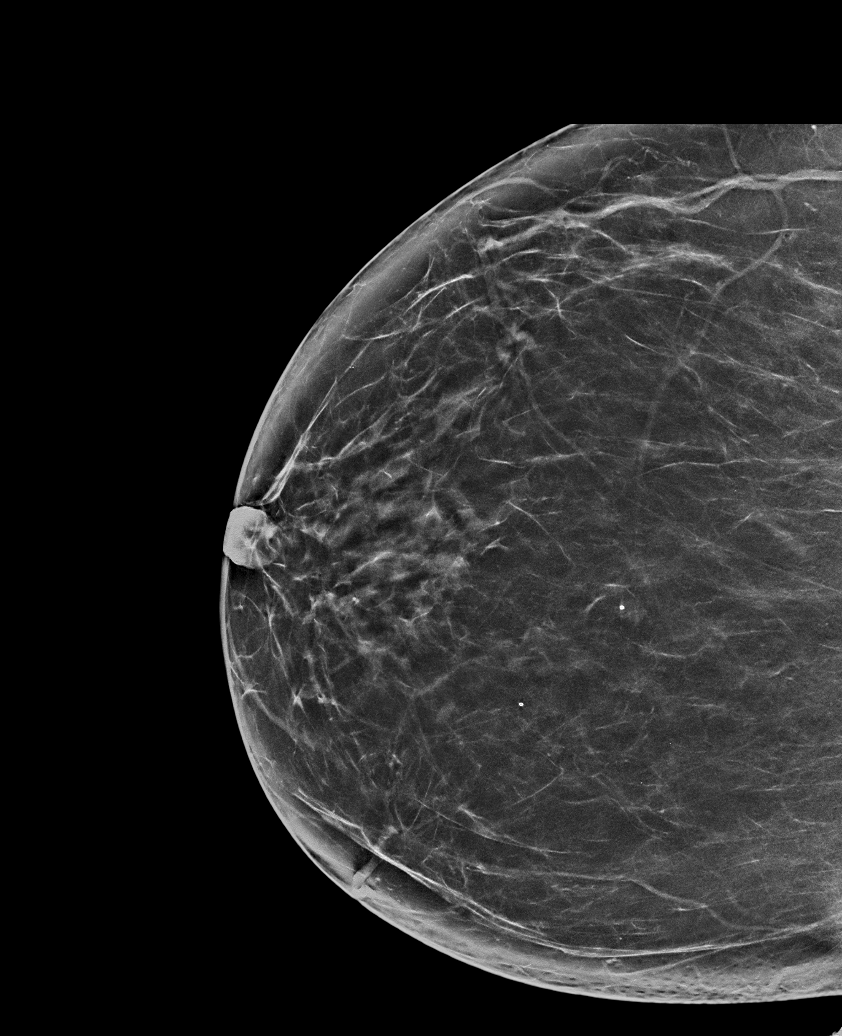

[L XCCL synth-2D]
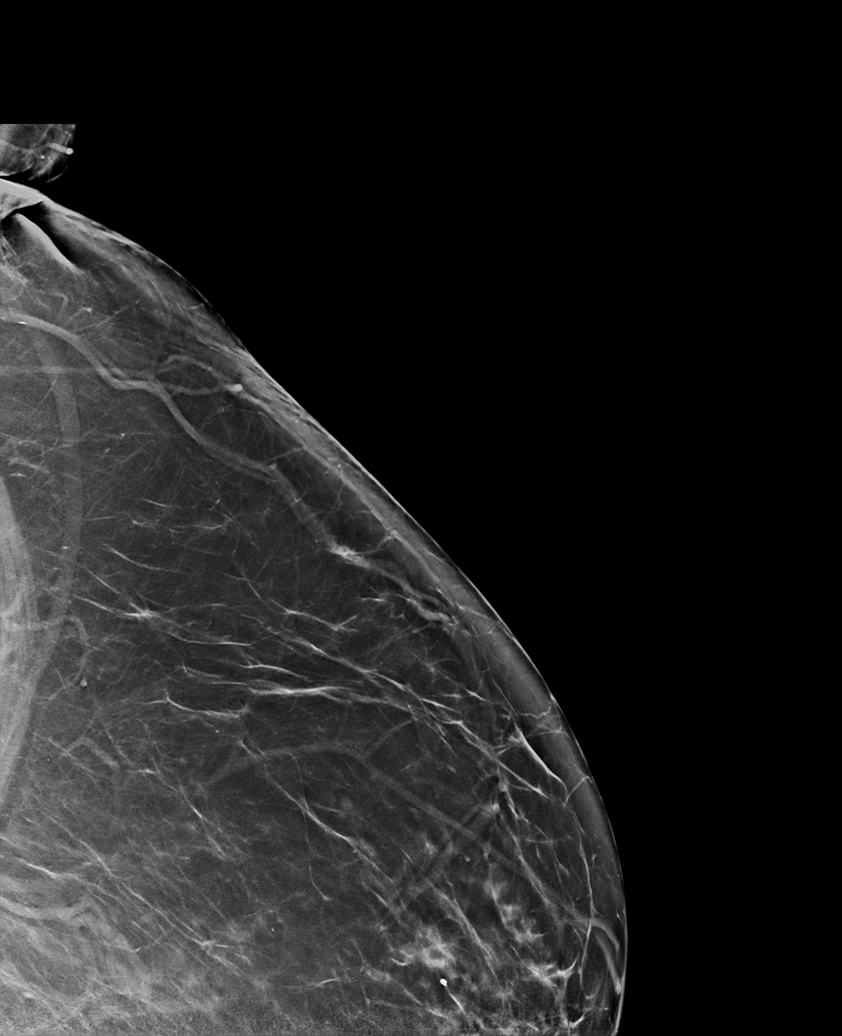

[L XCCL tomo · tomo slice 48/95.0]
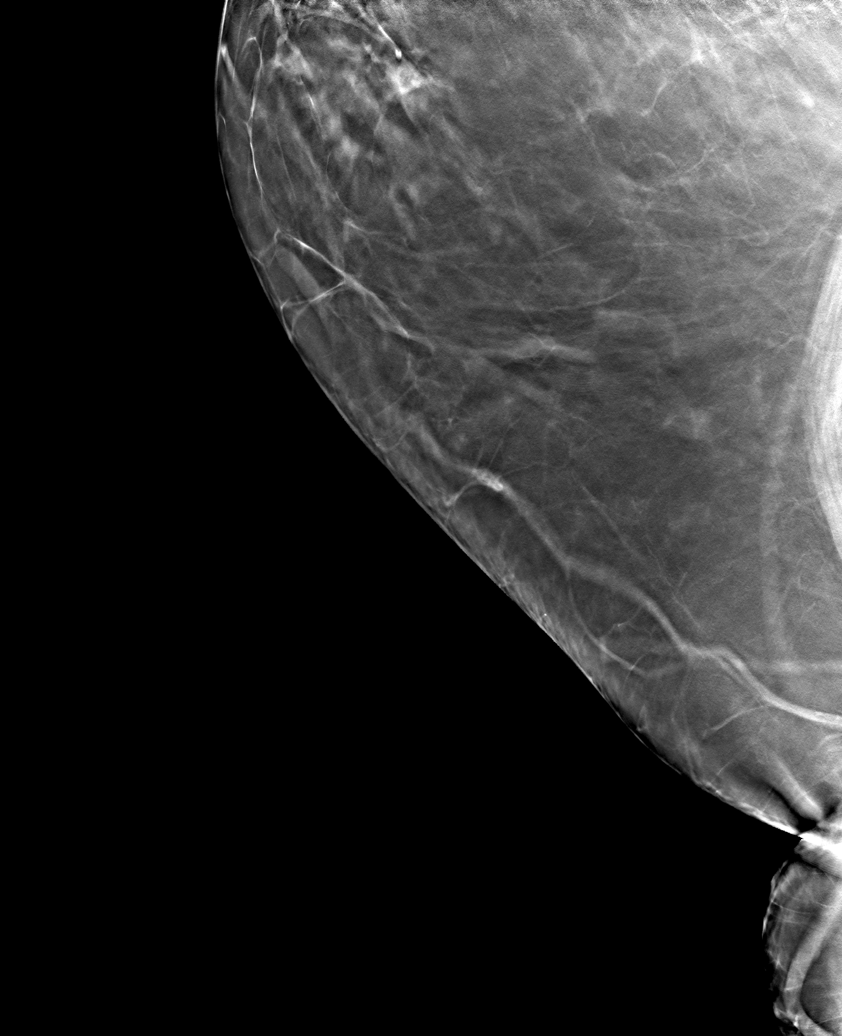

[6 of 30 positions shown; findings below may reference images not displayed]

ACR Breast Density Category b: There are scattered areas of
fibroglandular density.
FINDINGS: There are no findings suspicious for malignancy. Images were
processed with CAD.
IMPRESSION: No mammographic evidence of malignancy. A result letter of this
screening mammogram will be mailed directly to the patient.

RECOMMENDATION:
Screening mammogram in one year. (Code:CN-U-775)

BI-RADS CATEGORY  1: Negative.

## 2018-08-22 MED ORDER — FLUTICASONE-SALMETEROL 250-50 MCG/DOSE IN AEPB: 1.0000 | INHALATION_SPRAY | Freq: Two times a day (BID) | RESPIRATORY_TRACT | 3 refills | Status: DC

## 2018-08-27 DIAGNOSIS — J45901 Unspecified asthma with (acute) exacerbation: Secondary | ICD-10-CM | POA: Diagnosis not present

## 2018-09-02 ENCOUNTER — Ambulatory Visit: Payer: Self-pay | Admitting: Nurse Practitioner

## 2018-09-19 ENCOUNTER — Encounter: Payer: Self-pay | Admitting: Nurse Practitioner

## 2018-09-19 ENCOUNTER — Ambulatory Visit: Payer: Managed Care, Other (non HMO) | Admitting: Nurse Practitioner

## 2018-09-19 VITALS — BP 165/89 | HR 87 | Temp 98.3°F | Resp 16 | Ht 62.0 in | Wt 308.6 lb

## 2018-09-19 DIAGNOSIS — R062 Wheezing: Secondary | ICD-10-CM | POA: Diagnosis not present

## 2018-09-19 DIAGNOSIS — R05 Cough: Secondary | ICD-10-CM | POA: Diagnosis not present

## 2018-09-19 DIAGNOSIS — R059 Cough, unspecified: Secondary | ICD-10-CM

## 2018-09-19 DIAGNOSIS — J069 Acute upper respiratory infection, unspecified: Secondary | ICD-10-CM

## 2018-09-19 DIAGNOSIS — J4531 Mild persistent asthma with (acute) exacerbation: Secondary | ICD-10-CM | POA: Diagnosis not present

## 2018-09-19 MED ORDER — IPRATROPIUM-ALBUTEROL 0.5-2.5 (3) MG/3ML IN SOLN
3.0000 mL | Freq: Once | RESPIRATORY_TRACT | Status: AC
  Administered 2018-09-19: 3 mL via RESPIRATORY_TRACT

## 2018-09-19 MED ORDER — AMOXICILLIN-POT CLAVULANATE 875-125 MG PO TABS: 1.0000 | ORAL_TABLET | Freq: Two times a day (BID) | ORAL | 0 refills | Status: DC

## 2018-09-19 MED ORDER — METHYLPREDNISOLONE 4 MG PO TBPK: ORAL_TABLET | ORAL | 0 refills | Status: DC

## 2018-09-19 MED ORDER — HYDROCOD POLST-CPM POLST ER 10-8 MG/5ML PO SUER: 5.0000 mL | Freq: Two times a day (BID) | ORAL | 0 refills | Status: DC | PRN

## 2018-09-19 NOTE — Progress Notes (Signed)
[facility] [address] [place], [place] [address]  Internal MEDICINE  Office Visit Note  Patient Name: Victoria Castaneda  [id number]  [id number]  Date of Service: 09/19/2018   Pt is here for a sick visit.  Chief Complaint  Patient presents with  . Wheezing    been going on for about three days. no fever  . Cough  . Chills     The patient is here for sick visit. Today, she has wheezing, cough, and congestion. Has been going on for three days. Has been using her home nebulizer treatments and gets little relief. Does have asthma and URI symptoms generally result in asthma exacerbation.        Current Medication:  Outpatient Encounter Medications as of 09/19/2018  Medication Sig  . albuterol (PROVENTIL) (2.5 MG/3ML) 0.083% nebulizer solution Take 2.5 mg by nebulization every 6 (six) hours as needed for wheezing or shortness of breath.  . fluconazole (DIFLUCAN) 150 MG tablet Take 1 tablet po once. May repeat dose in 3 days as needed for persistent symptoms.  . fluticasone (FLONASE) 50 MCG/ACT nasal spray Place into both nostrils daily.  . Fluticasone-Salmeterol (ADVAIR) 250-50 MCG/DOSE AEPB Inhale 1 puff into the lungs 2 (two) times daily.  . furosemide (LASIX) 20 MG tablet Take 1 tablet (20 mg total) by mouth daily.  Marland Kitchen ipratropium-albuterol (DUONEB) 0.5-2.5 (3) MG/3ML SOLN Take 3 mLs by nebulization every 6 (six) hours as needed.  . phenazopyridine (PYRIDIUM) 200 MG tablet Take 1 tablet (200 mg total) by mouth 3 (three) times daily as needed for pain.  Marland Kitchen PROAIR HFA 108 (90 Base) MCG/ACT inhaler Inhale 2 puffs into the lungs every 6 (six) hours as needed for wheezing or shortness of breath.  Marland Kitchen amoxicillin-clavulanate (AUGMENTIN) 875-125 MG tablet Take 1 tablet by mouth 2 (two) times daily.  . chlorpheniramine-HYDROcodone (TUSSIONEX PENNKINETIC ER) 10-8 MG/5ML SUER Take 5 mLs by mouth every 12 (twelve) hours as needed for cough.  . methylPREDNISolone (MEDROL) 4 MG TBPK  tablet Take by mouth as directed for 6 days  . [DISCONTINUED] amoxicillin-clavulanate (AUGMENTIN) 875-125 MG tablet Take 1 tablet by mouth 2 (two) times daily. (Patient not taking: Reported on 05/31/2018)  . [EXPIRED] ipratropium-albuterol (DUONEB) 0.5-2.5 (3) MG/3ML nebulizer solution 3 mL    No facility-administered encounter medications on file as of 09/19/2018.       Medical History: Past Medical History:  Diagnosis Date  . Arthritis   . Asthma   . Diverticulitis 06/2017  . Headache   . Hypersomnia, unspecified   . Hypertension, essential   . Mild persistent asthma   . Mixed hyperlipidemia   . Motion sickness   . Primary ovarian failure   . Uncomplicated asthma   . Vitamin D deficiency, unspecified   . Wheezing      Today's Vitals   09/19/18 1011  BP: (!) 165/89  Pulse: 87  Resp: 16  Temp: 98.3 F (36.8 C)  SpO2: 97%  Weight: (!) 308 lb 9.6 oz (140 kg)  Height: 5\' 2"  (1.575 m)    Review of Systems  Constitutional: Positive for fatigue. Negative for fever.  HENT: Positive for congestion, postnasal drip, rhinorrhea and sore throat.   Respiratory: Positive for cough and wheezing.   Cardiovascular: Negative for chest pain and palpitations.       Elevated blood pressure today.  Gastrointestinal: Negative for nausea.  Endocrine: Negative.   Musculoskeletal: Positive for myalgias.  Allergic/Immunologic: Positive for environmental allergies.  Neurological: Positive for headaches.  Hematological: Positive for adenopathy.  Psychiatric/Behavioral: Negative.     Physical Exam Vitals signs and nursing note reviewed.  Constitutional:      General: She is not in acute distress.    Appearance: Normal appearance. She is well-developed. She is ill-appearing. She is not diaphoretic.  HENT:     Head: Normocephalic and atraumatic.     Ears:     Comments: Some redness and bulging noted in bilateral external ear canals.     Nose: Congestion and rhinorrhea present.      Mouth/Throat:     Pharynx: Posterior oropharyngeal erythema present. No oropharyngeal exudate.  Eyes:     Pupils: Pupils are equal, round, and reactive to light.  Neck:     Musculoskeletal: Normal range of motion and neck supple.     Thyroid: No thyromegaly.     Vascular: No JVD.     Trachea: No tracheal deviation.  Cardiovascular:     Rate and Rhythm: Normal rate and regular rhythm.     Heart sounds: Normal heart sounds. No murmur. No friction rub. No gallop.   Pulmonary:     Effort: Pulmonary effort is normal. No respiratory distress.     Breath sounds: Wheezing present. No rales.     Comments: Harsh, dry cough present.  Breathing treatment with DuoNeb administered in the office. Wheezing and cough improved after treatment.  Chest:     Chest wall: No tenderness.  Abdominal:     General: Bowel sounds are normal.     Palpations: Abdomen is soft.  Musculoskeletal: Normal range of motion.  Lymphadenopathy:     Cervical: Cervical adenopathy present.  Skin:    General: Skin is warm and dry.  Neurological:     Mental Status: She is alert and oriented to person, place, and time.     Cranial Nerves: No cranial nerve deficit.  Psychiatric:        Mood and Affect: Mood normal.        Behavior: Behavior normal.        Thought Content: Thought content normal.        Judgment: Judgment normal.   Assessment/Plan: 1. Acute upper respiratory infection Start augmentin 875mg  twice daily for 10 days. Rest and increase fluids. Use OTC medications to alleviate symptoms.  - amoxicillin-clavulanate (AUGMENTIN) 875-125 MG tablet; Take 1 tablet by mouth 2 (two) times daily.  Dispense: 20 tablet; Refill: 0  2. Wheezing Breathing treatment with duoneb given in the office. Wheezing improved following the treatment. She tolerated this well.   3. Mild persistent asthma with exacerbation Medrol dose pack. Take as directed for 6 days. Continue to use home neb treatments as needed and as prescribed.  -  methylPREDNISolone (MEDROL) 4 MG TBPK tablet; Take by mouth as directed for 6 days  Dispense: 21 tablet; Refill: 0  4. Cough tussionex cough supperssant may be taken twice daily as needed for cough. Recommended she take this only at night or when at home as this can make her drowsy or dizzy.  - chlorpheniramine-HYDROcodone (TUSSIONEX PENNKINETIC ER) 10-8 MG/5ML SUER; Take 5 mLs by mouth every 12 (twelve) hours as needed for cough.  Dispense: 115 mL; Refill: 0  General Counseling: Victoria Castaneda verbalizes understanding of the findings of todays visit and agrees with plan of treatment. I have discussed any further diagnostic evaluation that may be needed or ordered today. We also reviewed her medications today. she has been encouraged to call the office with any questions or concerns that should  arise related to todays visit.    Counseling:  Rest and increase fluids. Continue using OTC medication to control symptoms.   This patient was seen by Vincent GrosHeather Marcos Ruelas FNP Collaboration with Dr Lyndon CodeFozia M Khan as a part of collaborative care agreement  Meds ordered this encounter  Medications  . methylPREDNISolone (MEDROL) 4 MG TBPK tablet    Sig: Take by mouth as directed for 6 days    Dispense:  21 tablet    Refill:  0    Order Specific Question:   Supervising Provider    Answer:   Lyndon CodeKHAN, FOZIA M [[id number]]  . amoxicillin-clavulanate (AUGMENTIN) 875-125 MG tablet    Sig: Take 1 tablet by mouth 2 (two) times daily.    Dispense:  20 tablet    Refill:  0    Order Specific Question:   Supervising Provider    Answer:   Lyndon CodeKHAN, FOZIA M [[id number]]  . chlorpheniramine-HYDROcodone (TUSSIONEX PENNKINETIC ER) 10-8 MG/5ML SUER    Sig: Take 5 mLs by mouth every 12 (twelve) hours as needed for cough.    Dispense:  115 mL    Refill:  0    Order Specific Question:   Supervising Provider    Answer:   Lyndon CodeKHAN, FOZIA M [[id number]]  . ipratropium-albuterol (DUONEB) 0.5-2.5 (3) MG/3ML nebulizer solution 3 mL    Time spent: 25 Minutes

## 2018-09-26 DIAGNOSIS — J45901 Unspecified asthma with (acute) exacerbation: Secondary | ICD-10-CM | POA: Diagnosis not present

## 2018-10-30 ENCOUNTER — Encounter: Payer: Self-pay | Admitting: Adult Health

## 2018-10-30 ENCOUNTER — Other Ambulatory Visit: Payer: Self-pay | Admitting: Adult Health

## 2018-10-30 ENCOUNTER — Ambulatory Visit: Payer: Managed Care, Other (non HMO) | Admitting: Adult Health

## 2018-10-30 VITALS — BP 139/80 | HR 96 | Temp 98.6°F | Resp 16 | Ht 62.0 in | Wt 304.0 lb

## 2018-10-30 DIAGNOSIS — J0110 Acute frontal sinusitis, unspecified: Secondary | ICD-10-CM | POA: Diagnosis not present

## 2018-10-30 DIAGNOSIS — J069 Acute upper respiratory infection, unspecified: Secondary | ICD-10-CM | POA: Diagnosis not present

## 2018-10-30 DIAGNOSIS — R059 Cough, unspecified: Secondary | ICD-10-CM

## 2018-10-30 DIAGNOSIS — R05 Cough: Secondary | ICD-10-CM | POA: Diagnosis not present

## 2018-10-30 DIAGNOSIS — R062 Wheezing: Secondary | ICD-10-CM

## 2018-10-30 MED ORDER — FLUTICASONE-SALMETEROL 250-50 MCG/DOSE IN AEPB: 1.0000 | INHALATION_SPRAY | Freq: Two times a day (BID) | RESPIRATORY_TRACT | 3 refills | Status: DC

## 2018-10-30 MED ORDER — LEVOFLOXACIN 500 MG PO TABS: 500.0000 mg | ORAL_TABLET | Freq: Every day | ORAL | 0 refills | Status: DC

## 2018-10-30 MED ORDER — PROAIR HFA 108 (90 BASE) MCG/ACT IN AERS: 2.0000 | INHALATION_SPRAY | Freq: Four times a day (QID) | RESPIRATORY_TRACT | 5 refills | Status: DC | PRN

## 2018-10-30 MED ORDER — HYDROCOD POLST-CPM POLST ER 10-8 MG/5ML PO SUER: 5.0000 mL | Freq: Two times a day (BID) | ORAL | 0 refills | Status: DC | PRN

## 2018-10-30 NOTE — Patient Instructions (Signed)

## 2018-10-30 NOTE — Progress Notes (Signed)
[facility] [address] [place], [place] [address]  Internal MEDICINE  Office Visit Note  Patient Name: Victoria SartoriusSheila Castaneda  [id number]  [id number]  Date of Service: 10/30/2018  Chief Complaint  Patient presents with  . Sinusitis    going on for 3 weeks now   . Cough    green phlegm      HPI Pt is here for a sick visit.  She reports cough, sinus congestion x 3 weeks.  It is progressively worsening.  She reports nocturnal cough that keeps her awake.  She reports she is intermittently coughing up green mucus.  She is unsure if she has had a fever however she does report intermittent chills at times.     Current Medication:  Outpatient Encounter Medications as of 10/30/2018  Medication Sig  . albuterol (PROVENTIL) (2.5 MG/3ML) 0.083% nebulizer solution Take 2.5 mg by nebulization every 6 (six) hours as needed for wheezing or shortness of breath.  . fluconazole (DIFLUCAN) 150 MG tablet Take 1 tablet po once. May repeat dose in 3 days as needed for persistent symptoms.  . fluticasone (FLONASE) 50 MCG/ACT nasal spray Place into both nostrils daily.  . Fluticasone-Salmeterol (ADVAIR) 250-50 MCG/DOSE AEPB Inhale 1 puff into the lungs 2 (two) times daily.  . furosemide (LASIX) 20 MG tablet Take 1 tablet (20 mg total) by mouth daily.  Marland Kitchen. ipratropium-albuterol (DUONEB) 0.5-2.5 (3) MG/3ML SOLN Take 3 mLs by nebulization every 6 (six) hours as needed.  . phenazopyridine (PYRIDIUM) 200 MG tablet Take 1 tablet (200 mg total) by mouth 3 (three) times daily as needed for pain.  Marland Kitchen. PROAIR HFA 108 (90 Base) MCG/ACT inhaler Inhale 2 puffs into the lungs every 6 (six) hours as needed for wheezing or shortness of breath.  . chlorpheniramine-HYDROcodone (TUSSIONEX PENNKINETIC ER) 10-8 MG/5ML SUER Take 5 mLs by mouth every 12 (twelve) hours as needed for cough. (Patient not taking: Reported on 10/30/2018)  . [DISCONTINUED] amoxicillin-clavulanate (AUGMENTIN) 875-125 MG tablet Take 1 tablet by mouth  2 (two) times daily. (Patient not taking: Reported on 10/30/2018)  . [DISCONTINUED] Fluticasone-Salmeterol (ADVAIR) 250-50 MCG/DOSE AEPB Inhale 1 puff into the lungs 2 (two) times daily.  . [DISCONTINUED] methylPREDNISolone (MEDROL) 4 MG TBPK tablet Take by mouth as directed for 6 days (Patient not taking: Reported on 10/30/2018)  . [DISCONTINUED] PROAIR HFA 108 (90 Base) MCG/ACT inhaler Inhale 2 puffs into the lungs every 6 (six) hours as needed for wheezing or shortness of breath.   No facility-administered encounter medications on file as of 10/30/2018.       Medical History: Past Medical History:  Diagnosis Date  . Arthritis   . Asthma   . Diverticulitis 06/2017  . Headache   . Hypersomnia, unspecified   . Hypertension, essential   . Mild persistent asthma   . Mixed hyperlipidemia   . Motion sickness   . Primary ovarian failure   . Uncomplicated asthma   . Vitamin D deficiency, unspecified   . Wheezing      Vital Signs: BP 139/80 (BP Location: Right Arm, Patient Position: Sitting, Cuff Size: Large)   Pulse 96   Temp 98.6 F (37 C) (Oral)   Resp 16   Ht 5\' 2"  (1.575 m)   Wt (!) 304 lb (137.9 kg)   SpO2 96%   BMI 55.60 kg/m    Review of Systems  Constitutional: Positive for chills, fatigue and fever. Negative for unexpected weight change.  HENT: Positive for ear pain, postnasal drip, rhinorrhea, sinus  pressure and sinus pain. Negative for congestion and sneezing.   Eyes: Negative for photophobia, pain and redness.  Respiratory: Positive for cough and shortness of breath. Negative for chest tightness.   Cardiovascular: Negative for chest pain and palpitations.  Gastrointestinal: Negative for abdominal pain, constipation, diarrhea, nausea and vomiting.  Endocrine: Negative.   Genitourinary: Negative for dysuria and frequency.  Musculoskeletal: Negative for arthralgias, back pain, joint swelling and neck pain.  Skin: Negative for rash.  Allergic/Immunologic: Negative.    Neurological: Negative for tremors and numbness.  Hematological: Negative for adenopathy. Does not bruise/bleed easily.  Psychiatric/Behavioral: Negative for behavioral problems and sleep disturbance. The patient is not nervous/anxious.     Physical Exam Vitals signs and nursing note reviewed.  Constitutional:      General: She is not in acute distress.    Appearance: She is well-developed. She is not diaphoretic.  HENT:     Head: Normocephalic and atraumatic.     Mouth/Throat:     Pharynx: No oropharyngeal exudate.  Eyes:     Pupils: Pupils are equal, round, and reactive to light.  Neck:     Musculoskeletal: Normal range of motion and neck supple.     Thyroid: No thyromegaly.     Vascular: No JVD.     Trachea: No tracheal deviation.  Cardiovascular:     Rate and Rhythm: Normal rate and regular rhythm.     Heart sounds: Normal heart sounds. No murmur. No friction rub. No gallop.   Pulmonary:     Effort: Pulmonary effort is normal. No respiratory distress.     Breath sounds: Normal breath sounds. No wheezing or rales.  Chest:     Chest wall: No tenderness.  Abdominal:     Palpations: Abdomen is soft.     Tenderness: There is no abdominal tenderness. There is no guarding.  Musculoskeletal: Normal range of motion.  Lymphadenopathy:     Cervical: No cervical adenopathy.  Skin:    General: Skin is warm and dry.  Neurological:     Mental Status: She is alert and oriented to person, place, and time.     Cranial Nerves: No cranial nerve deficit.  Psychiatric:        Behavior: Behavior normal.        Thought Content: Thought content normal.        Judgment: Judgment normal.    Assessment/Plan: 1. Acute non-recurrent frontal sinusitis Prescribed course of Levaquin.  Instructed patient take all medications completion.  Instructed her to return to clinic if symptoms fail to improve in 7 to 10 days. - levofloxacin (LEVAQUIN) 500 MG tablet; Take 1 tablet (500 mg total) by mouth  daily.  Dispense: 10 tablet; Refill: 0  2. Upper respiratory tract infection, unspecified type - levofloxacin (LEVAQUIN) 500 MG tablet; Take 1 tablet (500 mg total) by mouth daily.  Dispense: 10 tablet; Refill: 0  3. Cough Refilled patient's Tussionex for cough.  Discussed that she should not take any other narcotic medications or use any alcohol while taking this medication.  Patient verbalized understanding. - chlorpheniramine-HYDROcodone (TUSSIONEX PENNKINETIC ER) 10-8 MG/5ML SUER; Take 5 mLs by mouth every 12 (twelve) hours as needed for cough.  Dispense: 115 mL; Refill: 0 Reviewed risks and possible side effects associated with taking opiates, benzodiazepines and other CNS depressants. Combination of these could cause dizziness and drowsiness. Advised patient not to drive or operate machinery when taking these medications, as patient's and other's life can be at risk and will have consequences.  Patient verbalized understanding in this matter. Dependence and abuse for these drugs will be monitored closely. A Controlled substance policy and procedure is on file which allows [facility] to order a urine drug screen test at any visit. Patient understands and agrees with the plan  General Counseling: Victoria AgarSheila verbalizes understanding of the findings of todays visit and agrees with plan of treatment. I have discussed any further diagnostic evaluation that may be needed or ordered today. We also reviewed her medications today. she has been encouraged to call the office with any questions or concerns that should arise related to todays visit.   No orders of the defined types were placed in this encounter.   No orders of the defined types were placed in this encounter.   Time spent: 25 Minutes  This patient was seen by Blima LedgerAdam Husein Guedes AGNP-C in Collaboration with Dr Lyndon CodeFozia M Khan as a part of collaborative care agreement.  Johnna AcostaAdam J. Shevette Bess AGNP-C Internal Medicine

## 2018-11-21 ENCOUNTER — Other Ambulatory Visit: Payer: Self-pay

## 2018-11-21 MED ORDER — FUROSEMIDE 20 MG PO TABS: 20.0000 mg | ORAL_TABLET | Freq: Every day | ORAL | 1 refills | Status: DC

## 2019-01-06 ENCOUNTER — Other Ambulatory Visit: Payer: Self-pay | Admitting: Internal Medicine

## 2019-01-06 DIAGNOSIS — Z1231 Encounter for screening mammogram for malignant neoplasm of breast: Secondary | ICD-10-CM

## 2019-03-05 ENCOUNTER — Other Ambulatory Visit: Payer: Self-pay

## 2019-03-05 ENCOUNTER — Ambulatory Visit
Admission: RE | Admit: 2019-03-05 | Discharge: 2019-03-05 | Disposition: A | Discharge status: Home / Self Care | Payer: Managed Care, Other (non HMO) | Source: Ambulatory Visit | Attending: Internal Medicine | Admitting: Internal Medicine

## 2019-03-05 DIAGNOSIS — Z1231 Encounter for screening mammogram for malignant neoplasm of breast: Secondary | ICD-10-CM | POA: Diagnosis not present

## 2019-03-21 ENCOUNTER — Ambulatory Visit (INDEPENDENT_AMBULATORY_CARE_PROVIDER_SITE_OTHER): Payer: Managed Care, Other (non HMO) | Admitting: Nurse Practitioner

## 2019-03-21 ENCOUNTER — Other Ambulatory Visit: Payer: Self-pay

## 2019-03-21 ENCOUNTER — Encounter: Payer: Self-pay | Admitting: Nurse Practitioner

## 2019-03-21 VITALS — BP 130/82 | HR 76 | Resp 16 | Ht 62.0 in | Wt 304.0 lb

## 2019-03-21 DIAGNOSIS — M19011 Primary osteoarthritis, right shoulder: Secondary | ICD-10-CM | POA: Diagnosis not present

## 2019-03-21 DIAGNOSIS — J4520 Mild intermittent asthma, uncomplicated: Secondary | ICD-10-CM

## 2019-03-21 DIAGNOSIS — R300 Dysuria: Secondary | ICD-10-CM

## 2019-03-21 DIAGNOSIS — Z0001 Encounter for general adult medical examination with abnormal findings: Secondary | ICD-10-CM | POA: Diagnosis not present

## 2019-03-21 DIAGNOSIS — I10 Essential (primary) hypertension: Secondary | ICD-10-CM

## 2019-03-21 MED ORDER — IBUPROFEN 800 MG PO TABS: 800.0000 mg | ORAL_TABLET | Freq: Three times a day (TID) | ORAL | 1 refills | Status: DC | PRN

## 2019-03-21 MED ORDER — PREDNISONE 10 MG (21) PO TBPK: ORAL_TABLET | ORAL | 0 refills | Status: DC

## 2019-03-21 NOTE — Progress Notes (Signed)
[facility] [address] [place], [place] [address]  Internal MEDICINE  Office Visit Note  Patient Name: Victoria SartoriusSheila Castaneda  [id number]  [id number]  Date of Service: 03/22/2019   Pt is here for routine health maintenance examination   Chief Complaint  Patient presents with  . Annual Exam  . Hyperlipidemia     The patient is here for health maintenance exam today. She states that she is having right shoulder pain. This is across the top of her shoulder. It radiates into the right side of the neck. ROM and strength are reduced due to pain. She states that otherwise, she is doing well. She has been working from home for the past three months. Asthma flares are well controlled. Blood pressure is stable.    Current Medication: Outpatient Encounter Medications as of 03/21/2019  Medication Sig  . albuterol (PROVENTIL) (2.5 MG/3ML) 0.083% nebulizer solution Take 2.5 mg by nebulization every 6 (six) hours as needed for wheezing or shortness of breath.  . fluticasone (FLONASE) 50 MCG/ACT nasal spray Place into both nostrils daily.  . Fluticasone-Salmeterol (ADVAIR) 250-50 MCG/DOSE AEPB Inhale 1 puff into the lungs 2 (two) times daily.  . furosemide (LASIX) 20 MG tablet Take 1 tablet (20 mg total) by mouth daily.  Marland Kitchen. ipratropium-albuterol (DUONEB) 0.5-2.5 (3) MG/3ML SOLN Take 3 mLs by nebulization every 6 (six) hours as needed.  Marland Kitchen. PROAIR HFA 108 (90 Base) MCG/ACT inhaler Inhale 2 puffs into the lungs every 6 (six) hours as needed for wheezing or shortness of breath.  . [DISCONTINUED] chlorpheniramine-HYDROcodone (TUSSIONEX PENNKINETIC ER) 10-8 MG/5ML SUER Take 5 mLs by mouth every 12 (twelve) hours as needed for cough.  . [DISCONTINUED] fluconazole (DIFLUCAN) 150 MG tablet Take 1 tablet po once. May repeat dose in 3 days as needed for persistent symptoms.  . [DISCONTINUED] levofloxacin (LEVAQUIN) 500 MG tablet Take 1 tablet (500 mg total) by mouth daily.  . [DISCONTINUED] phenazopyridine  (PYRIDIUM) 200 MG tablet Take 1 tablet (200 mg total) by mouth 3 (three) times daily as needed for pain.  Marland Kitchen. ibuprofen (ADVIL) 800 MG tablet Take 1 tablet (800 mg total) by mouth every 8 (eight) hours as needed.  . predniSONE (STERAPRED UNI-PAK 21 TAB) 10 MG (21) TBPK tablet 6 day taper - take by mouth as directed for 6 days   No facility-administered encounter medications on file as of 03/21/2019.     Surgical History: Past Surgical History:  Procedure Laterality Date  . ABDOMINAL HYSTERECTOMY    . CHOLECYSTECTOMY    . COLONOSCOPY WITH PROPOFOL N/A 12/10/2015   Procedure: COLONOSCOPY WITH PROPOFOL;  Surgeon: Midge Miniumarren Wohl, MD;  [facility];  Service: Endoscopy;  Laterality: N/A;    Medical History: Past Medical History:  Diagnosis Date  . Arthritis   . Asthma   . Diverticulitis 06/2017  . Headache   . Hypersomnia, unspecified   . Mild persistent asthma   . Mixed hyperlipidemia   . Motion sickness   . Primary ovarian failure   . Uncomplicated asthma   . Vitamin D deficiency, unspecified   . Wheezing     Family History: Family History  Problem Relation Age of Onset  . Diabetes Mother   . Breast cancer Neg Hx       Review of Systems  Constitutional: Positive for fatigue. Negative for activity change and fever.  HENT: Positive for congestion. Negative for postnasal drip, rhinorrhea and sore throat.   Respiratory: Negative for cough and wheezing.   Cardiovascular: Negative  for chest pain and palpitations.  Gastrointestinal: Negative for abdominal distention, constipation, diarrhea, nausea and vomiting.  Endocrine: Negative for cold intolerance, heat intolerance, polydipsia and polyuria.  Genitourinary: Negative for dysuria, flank pain, frequency, pelvic pain and urgency.  Musculoskeletal: Positive for arthralgias and myalgias.       Right shoulder pain.   Allergic/Immunologic: Positive for environmental allergies.  Neurological: Positive for headaches.   Hematological: Positive for adenopathy.  Psychiatric/Behavioral: Negative for dysphoric mood. The patient is not nervous/anxious.      Today's Vitals   03/21/19 1408  BP: 130/82  Pulse: 76  Resp: 16  SpO2: 99%  Weight: (!) 304 lb (137.9 kg)  Height: 5\' 2"  (1.575 m)   Body mass index is 55.6 kg/m.  Physical Exam Vitals signs and nursing note reviewed.  Constitutional:      General: She is not in acute distress.    Appearance: Normal appearance. She is well-developed. She is obese. She is not diaphoretic.  HENT:     Head: Normocephalic and atraumatic.     Mouth/Throat:     Pharynx: No oropharyngeal exudate.  Eyes:     Extraocular Movements: Extraocular movements intact.     Conjunctiva/sclera: Conjunctivae normal.     Pupils: Pupils are equal, round, and reactive to light.  Neck:     Musculoskeletal: Normal range of motion and neck supple.     Thyroid: No thyromegaly.     Vascular: No carotid bruit or JVD.     Trachea: No tracheal deviation.  Cardiovascular:     Rate and Rhythm: Normal rate and regular rhythm.     Pulses: Normal pulses.     Heart sounds: Normal heart sounds. No murmur. No friction rub. No gallop.   Pulmonary:     Effort: Pulmonary effort is normal. No respiratory distress.     Breath sounds: Normal breath sounds. No wheezing or rales.  Chest:     Chest wall: No tenderness.     Breasts:        Right: Normal. No swelling, bleeding, inverted nipple, mass, nipple discharge, skin change or tenderness.        Left: Normal. No swelling, bleeding, inverted nipple, mass, nipple discharge, skin change or tenderness.  Abdominal:     General: Bowel sounds are normal.     Palpations: Abdomen is soft.     Tenderness: There is no abdominal tenderness. There is no guarding.  Musculoskeletal: Normal range of motion.     Comments: Moderate shoulder pain across the superior aspect of the right shoulder. There is mild reduction of ROM and strength in right shoulder.  No abnormalities or deformities noted at this time.   Lymphadenopathy:     Cervical: No cervical adenopathy.  Skin:    General: Skin is warm and dry.  Neurological:     Mental Status: She is alert and oriented to person, place, and time.     Cranial Nerves: No cranial nerve deficit.  Psychiatric:        Behavior: Behavior normal.        Thought Content: Thought content normal.        Judgment: Judgment normal.   Assessment/Plan: 1. Encounter for general adult medical examination with abnormal findings Annual health maintenance exam today.   2. Essential (primary) hypertension Stable. Continue bp medication as prescribed   3. Primary osteoarthritis of right shoulder Start ibuprofen 800mg  up to three times daily as needed for pain and inflammation. Add prednisone 10mg  taper. Take as directed  for 6 days.  - predniSONE (STERAPRED UNI-PAK 21 TAB) 10 MG (21) TBPK tablet; 6 day taper - take by mouth as directed for 6 days  Dispense: 21 tablet; Refill: 0 - ibuprofen (ADVIL) 800 MG tablet; Take 1 tablet (800 mg total) by mouth every 8 (eight) hours as needed.  Dispense: 90 tablet; Refill: 1  4. Mild intermittent asthma without complication Continue to use inhalers as prescribed   5. Dysuria - UA/M w/rflx Culture, Routine  General Counseling: Vira AgarSheila verbalizes understanding of the findings of todays visit and agrees with plan of treatment. I have discussed any further diagnostic evaluation that may be needed or ordered today. We also reviewed her medications today. she has been encouraged to call the office with any questions or concerns that should arise related to todays visit.    Counseling:  Hypertension Counseling:   The following hypertensive lifestyle modification were recommended and discussed:  1. Limiting alcohol intake to less than 1 oz/day of ethanol:(24 oz of beer or 8 oz of wine or 2 oz of 100-proof whiskey). 2. Take baby ASA 81 mg daily. 3. Importance of regular aerobic  exercise and losing weight. 4. Reduce dietary saturated fat and cholesterol intake for overall cardiovascular health. 5. Maintaining adequate dietary potassium, calcium, and magnesium intake. 6. Regular monitoring of the blood pressure. 7. Reduce sodium intake to less than 100 mmol/day (less than 2.3 gm of sodium or less than 6 gm of sodium choride)   This patient was seen by Vincent GrosHeather Serrena Linderman FNP Collaboration with Dr Lyndon CodeFozia M Khan as a part of collaborative care agreement  Orders Placed This Encounter  Procedures  . UA/M w/rflx Culture, Routine    Meds ordered this encounter  Medications  . predniSONE (STERAPRED UNI-PAK 21 TAB) 10 MG (21) TBPK tablet    Sig: 6 day taper - take by mouth as directed for 6 days    Dispense:  21 tablet    Refill:  0    Order Specific Question:   Supervising Provider    Answer:   Lyndon CodeKHAN, FOZIA M [[id number]]  . ibuprofen (ADVIL) 800 MG tablet    Sig: Take 1 tablet (800 mg total) by mouth every 8 (eight) hours as needed.    Dispense:  90 tablet    Refill:  1    Order Specific Question:   Supervising Provider    Answer:   Lyndon CodeKHAN, FOZIA M [[id number]]    Time spent: 9830 Minutes      Lyndon CodeFozia M Khan, MD  Internal Medicine

## 2019-03-22 DIAGNOSIS — M19011 Primary osteoarthritis, right shoulder: Secondary | ICD-10-CM | POA: Insufficient documentation

## 2019-03-22 DIAGNOSIS — J4520 Mild intermittent asthma, uncomplicated: Secondary | ICD-10-CM | POA: Insufficient documentation

## 2019-03-23 LAB — MICROSCOPIC EXAMINATION

## 2019-03-24 ENCOUNTER — Other Ambulatory Visit: Payer: Self-pay | Admitting: Nurse Practitioner

## 2019-03-24 ENCOUNTER — Telehealth: Payer: Self-pay

## 2019-03-24 DIAGNOSIS — N390 Urinary tract infection, site not specified: Secondary | ICD-10-CM

## 2019-03-24 MED ORDER — SULFAMETHOXAZOLE-TRIMETHOPRIM 800-160 MG PO TABS: 1.0000 | ORAL_TABLET | Freq: Two times a day (BID) | ORAL | 0 refills | Status: DC

## 2019-03-24 NOTE — Progress Notes (Signed)
Urine sample from physical indicates uti. I have sent prescription for bactrim DS bid for 10 days to [address]. Will change as indicated based on culture results which are not back yet.

## 2019-03-24 NOTE — Telephone Encounter (Signed)
-----   Message from Ronnell Freshwater, NP sent at 03/24/2019  8:19 AM EDT ----- Please let the patient know Urine sample from physical indicates uti. I have sent prescription for bactrim DS bid for 10 days to [address]. Will change as indicated based on culture results which are not back yet.  thanks

## 2019-03-24 NOTE — Telephone Encounter (Signed)
Pt advised for urine result

## 2019-03-29 LAB — UA/M W/RFLX CULTURE, ROUTINE
Bilirubin, UA: NEGATIVE
Glucose, UA: NEGATIVE
Ketones, UA: NEGATIVE
Leukocytes,UA: NEGATIVE
Nitrite, UA: POSITIVE — AB
Protein,UA: NEGATIVE
RBC, UA: NEGATIVE
Specific Gravity, UA: 1.016 (ref 1.005–1.030)
Urobilinogen, Ur: 0.2 mg/dL (ref 0.2–1.0)
pH, UA: 5 (ref 5.0–7.5)

## 2019-03-29 LAB — MICROSCOPIC EXAMINATION
Casts: NONE SEEN /lpf
Epithelial Cells (non renal): >10 /hpf — AB (ref 0–10)

## 2019-03-29 LAB — URINE CULTURE, REFLEX

## 2019-03-30 ENCOUNTER — Other Ambulatory Visit: Payer: Self-pay | Admitting: Nurse Practitioner

## 2019-03-30 DIAGNOSIS — B379 Candidiasis, unspecified: Secondary | ICD-10-CM

## 2019-03-30 MED ORDER — FLUCONAZOLE 150 MG PO TABS: ORAL_TABLET | ORAL | 0 refills | Status: DC

## 2019-03-30 NOTE — Progress Notes (Signed)
Added diflucan 150mg  every other day for three doses. Sent to [facility] [address].

## 2019-03-31 ENCOUNTER — Telehealth: Payer: Self-pay

## 2019-03-31 NOTE — Telephone Encounter (Signed)
-----   Message from Ronnell Freshwater, NP sent at 03/30/2019  1:42 PM EDT ----- Please let the patient know that I have added diflucan 150mg  every other day for three doses due to presence of yeast in the urine. Sent to [facility] [address].  Thanks.

## 2019-03-31 NOTE — Telephone Encounter (Signed)
Pt advised we sent diflucan to [facility]

## 2019-04-14 ENCOUNTER — Telehealth: Payer: Self-pay

## 2019-04-16 ENCOUNTER — Other Ambulatory Visit: Payer: Self-pay | Admitting: Nurse Practitioner

## 2019-04-16 DIAGNOSIS — N390 Urinary tract infection, site not specified: Secondary | ICD-10-CM

## 2019-04-16 MED ORDER — NITROFURANTOIN MONOHYD MACRO 100 MG PO CAPS: 100.0000 mg | ORAL_CAPSULE | Freq: Two times a day (BID) | ORAL | 0 refills | Status: DC

## 2019-04-16 NOTE — Telephone Encounter (Signed)
Informed pt of new rx sent to pharmacy for UTI.

## 2019-04-16 NOTE — Telephone Encounter (Signed)
Patient still having symptoms of uti after completing treatment with bactrim DS. Will do round macrobid 100mg twice daily for next 10 days. Sent new prescription to [facility] [address].  

## 2019-04-16 NOTE — Progress Notes (Signed)
Patient still having symptoms of uti after completing treatment with bactrim DS. Will do round macrobid 100mg  twice daily for next 10 days. Sent new prescription to [address].

## 2019-05-06 ENCOUNTER — Other Ambulatory Visit: Payer: Self-pay

## 2019-05-12 ENCOUNTER — Other Ambulatory Visit: Payer: Self-pay

## 2019-05-12 MED ORDER — FLUTICASONE-SALMETEROL 250-50 MCG/DOSE IN AEPB: 1.0000 | INHALATION_SPRAY | Freq: Two times a day (BID) | RESPIRATORY_TRACT | 3 refills | Status: DC

## 2019-05-19 ENCOUNTER — Ambulatory Visit: Payer: Managed Care, Other (non HMO) | Admitting: Nurse Practitioner

## 2019-05-19 ENCOUNTER — Encounter: Payer: Self-pay | Admitting: Nurse Practitioner

## 2019-05-19 ENCOUNTER — Other Ambulatory Visit: Payer: Self-pay

## 2019-05-19 DIAGNOSIS — J019 Acute sinusitis, unspecified: Secondary | ICD-10-CM | POA: Diagnosis not present

## 2019-05-19 DIAGNOSIS — J4520 Mild intermittent asthma, uncomplicated: Secondary | ICD-10-CM | POA: Diagnosis not present

## 2019-05-19 MED ORDER — SULFAMETHOXAZOLE-TRIMETHOPRIM 800-160 MG PO TABS: 1.0000 | ORAL_TABLET | Freq: Two times a day (BID) | ORAL | 0 refills | Status: DC

## 2019-05-19 NOTE — Progress Notes (Signed)
[facility] [address] [place], [place] [address]  Internal MEDICINE  Telephone Visit  Patient Name: Victoria Castaneda  [id number]  [id number]  Date of Service: 05/19/2019  I connected with the patient at 12:36pm by webcam and verified the patients identity using two identifiers.   I discussed the limitations, risks, security and privacy concerns of performing an evaluation and management service by webcam and the availability of in person appointments. I also discussed with the patient that there may be a patient responsible charge related to the service.  The patient expressed understanding and agrees to proceed.    Chief Complaint  Patient presents with  . Telephone Screen  . Sinusitis    nagging pressure in face and head since Friday ,no fever but has had chills   . Cough  . Telephone Assessment    The patient has been contacted via webcam for follow up visit due to concerns for spread of novel coronavirus. The patient states that she feels like she has sinus infection. States that on Friday, she got a throbbing headache. She has chills but denies fever. She has mild cough. She states that she has had post nasal drip and nausea. Denies vomiting. She does not believe she has been exposed to anyone with COVID 19.       Current Medication: Outpatient Encounter Medications as of 05/19/2019  Medication Sig  . albuterol (PROVENTIL) (2.5 MG/3ML) 0.083% nebulizer solution Take 2.5 mg by nebulization every 6 (six) hours as needed for wheezing or shortness of breath.  . Fluticasone-Salmeterol (ADVAIR) 250-50 MCG/DOSE AEPB Inhale 1 puff into the lungs 2 (two) times daily.  . furosemide (LASIX) 20 MG tablet Take 1 tablet (20 mg total) by mouth daily.  Marland Kitchen. ibuprofen (ADVIL) 800 MG tablet Take 1 tablet (800 mg total) by mouth every 8 (eight) hours as needed.  Marland Kitchen. ipratropium-albuterol (DUONEB) 0.5-2.5 (3) MG/3ML SOLN Take 3 mLs by nebulization every 6 (six) hours as needed.  .  fluticasone (FLONASE) 50 MCG/ACT nasal spray Place into both nostrils daily.  Marland Kitchen. PROAIR HFA 108 (90 Base) MCG/ACT inhaler Inhale 2 puffs into the lungs every 6 (six) hours as needed for wheezing or shortness of breath.  . sulfamethoxazole-trimethoprim (BACTRIM DS) 800-160 MG tablet Take 1 tablet by mouth 2 (two) times daily.  . [DISCONTINUED] fluconazole (DIFLUCAN) 150 MG tablet Take 1 tablet po QOD for 3 doses (Patient not taking: Reported on 05/19/2019)  . [DISCONTINUED] nitrofurantoin, macrocrystal-monohydrate, (MACROBID) 100 MG capsule Take 1 capsule (100 mg total) by mouth 2 (two) times daily. (Patient not taking: Reported on 05/19/2019)  . [DISCONTINUED] predniSONE (STERAPRED UNI-PAK 21 TAB) 10 MG (21) TBPK tablet 6 day taper - take by mouth as directed for 6 days (Patient not taking: Reported on 05/19/2019)  . [DISCONTINUED] sulfamethoxazole-trimethoprim (BACTRIM DS) 800-160 MG tablet Take 1 tablet by mouth 2 (two) times daily. (Patient not taking: Reported on 05/19/2019)   No facility-administered encounter medications on file as of 05/19/2019.     Surgical History: Past Surgical History:  Procedure Laterality Date  . ABDOMINAL HYSTERECTOMY    . CHOLECYSTECTOMY    . COLONOSCOPY WITH PROPOFOL N/A 12/10/2015   Procedure: COLONOSCOPY WITH PROPOFOL;  Surgeon: Midge Miniumarren Wohl, MD;  [facility];  Service: Endoscopy;  Laterality: N/A;    Medical History: Past Medical History:  Diagnosis Date  . Arthritis   . Asthma   . Diverticulitis 06/2017  . Headache   . Hypersomnia, unspecified   . Mild persistent asthma   .  Mixed hyperlipidemia   . Motion sickness   . Primary ovarian failure   . Uncomplicated asthma   . Vitamin D deficiency, unspecified   . Wheezing     Family History: Family History  Problem Relation Age of Onset  . Diabetes Mother   . Breast cancer Neg Hx     Social History   Socioeconomic History  . Marital status: Single    Spouse name: Not on file  .  Number of children: Not on file  . Years of education: Not on file  . Highest education level: Not on file  Occupational History  . Not on file  Social Needs  . Financial resource strain: Not on file  . Food insecurity    Worry: Not on file    Inability: Not on file  . Transportation needs    Medical: Not on file    Non-medical: Not on file  Tobacco Use  . Smoking status: Never Smoker  . Smokeless tobacco: Never Used  Substance and Sexual Activity  . Alcohol use: Yes    Comment: occasionally  . Drug use: No  . Sexual activity: Not on file  Lifestyle  . Physical activity    Days per week: Not on file    Minutes per session: Not on file  . Stress: Not on file  Relationships  . Social Herbalist on phone: Not on file    Gets together: Not on file    Attends religious service: Not on file    Active member of club or organization: Not on file    Attends meetings of clubs or organizations: Not on file    Relationship status: Not on file  . Intimate partner violence    Fear of current or ex partner: Not on file    Emotionally abused: Not on file    Physically abused: Not on file    Forced sexual activity: Not on file  Other Topics Concern  . Not on file  Social History Narrative  . Not on file      Review of Systems  Constitutional: Positive for chills and fatigue. Negative for fever.  HENT: Positive for congestion, postnasal drip, rhinorrhea, sinus pain and sore throat. Negative for ear pain and voice change.   Respiratory: Positive for cough. Negative for wheezing.   Cardiovascular: Negative for chest pain and palpitations.  Gastrointestinal: Positive for nausea. Negative for vomiting.  Allergic/Immunologic: Positive for environmental allergies.  Neurological: Positive for headaches.    Vital Signs: There were no vitals taken for this visit.   Observation/Objective:   The patient is alert and oriented. She is pleasant and answers all questions  appropriately. Breathing is non-labored. She is in no acute distress at this time. She is congested and appears like she is not feeling well.   Assessment/Plan: 1. Acute non-recurrent sinusitis, unspecified location Start bactrim DS bid for 10 days. Rest and increase fluids. Use OTC medication as needed and as indicated for acute symptoms.  - sulfamethoxazole-trimethoprim (BACTRIM DS) 800-160 MG tablet; Take 1 tablet by mouth 2 (two) times daily.  Dispense: 20 tablet; Refill: 0  2. Mild intermittent asthma without complication Use inhalers as needed and as prescribed    General Counseling: valinda fedie understanding of the findings of today's phone visit and agrees with plan of treatment. I have discussed any further diagnostic evaluation that may be needed or ordered today. We also reviewed her medications today. she has been encouraged to call the  office with any questions or concerns that should arise related to todays visit.  This patient was seen by Vincent GrosHeather Vonzell Lindblad FNP Collaboration with Dr Lyndon CodeFozia M Khan as a part of collaborative care agreement  Meds ordered this encounter  Medications  . sulfamethoxazole-trimethoprim (BACTRIM DS) 800-160 MG tablet    Sig: Take 1 tablet by mouth 2 (two) times daily.    Dispense:  20 tablet    Refill:  0    Order Specific Question:   Supervising Provider    Answer:   Lyndon CodeKHAN, FOZIA M [[id number]]    Time spent: 5215 Minutes    Dr Lyndon CodeFozia M Khan Internal medicine

## 2019-06-23 ENCOUNTER — Other Ambulatory Visit: Payer: Self-pay

## 2019-06-23 MED ORDER — FUROSEMIDE 20 MG PO TABS: 20.0000 mg | ORAL_TABLET | Freq: Every day | ORAL | 1 refills | Status: DC

## 2019-08-01 ENCOUNTER — Ambulatory Visit: Payer: Managed Care, Other (non HMO) | Admitting: Nurse Practitioner

## 2019-08-01 ENCOUNTER — Encounter: Payer: Self-pay | Admitting: Nurse Practitioner

## 2019-08-01 ENCOUNTER — Other Ambulatory Visit: Payer: Self-pay

## 2019-08-01 VITALS — BP 146/74 | HR 85 | Temp 97.8°F | Resp 16 | Ht 62.0 in | Wt 305.0 lb

## 2019-08-01 DIAGNOSIS — K5792 Diverticulitis of intestine, part unspecified, without perforation or abscess without bleeding: Secondary | ICD-10-CM

## 2019-08-01 DIAGNOSIS — R1084 Generalized abdominal pain: Secondary | ICD-10-CM | POA: Diagnosis not present

## 2019-08-01 DIAGNOSIS — R062 Wheezing: Secondary | ICD-10-CM

## 2019-08-01 MED ORDER — PROAIR HFA 108 (90 BASE) MCG/ACT IN AERS: 2.0000 | INHALATION_SPRAY | Freq: Four times a day (QID) | RESPIRATORY_TRACT | 5 refills | Status: DC | PRN

## 2019-08-01 MED ORDER — HYDROCODONE-ACETAMINOPHEN 5-325 MG PO TABS: 1.0000 | ORAL_TABLET | Freq: Three times a day (TID) | ORAL | 0 refills | Status: DC | PRN

## 2019-08-01 MED ORDER — METRONIDAZOLE 500 MG PO TABS: 500.0000 mg | ORAL_TABLET | Freq: Two times a day (BID) | ORAL | 0 refills | Status: DC

## 2019-08-01 MED ORDER — CIPROFLOXACIN HCL 500 MG PO TABS: 500.0000 mg | ORAL_TABLET | Freq: Two times a day (BID) | ORAL | 0 refills | Status: DC

## 2019-08-01 NOTE — Progress Notes (Signed)
[facility] [address] [place], [place] [address]  Internal MEDICINE  Office Visit Note  Patient Name: Victoria Castaneda  [id number]  [id number]  Date of Service: 08/17/2019   Pt is here for a sick visit.  Chief Complaint  Patient presents with  . Diverticulitis    stomach pains and issues using the bathroom      The patient is here for acute visit. Believes she is having flare of diverticulitis. She has pain in left lower quadrant abdominal pain and cramping. Has been going on for past few days. Has had this in the past and diagnosed with diverticulitis of sigmoid colon. She denies nausea, vomiting, or diarrhea. She denies fever.        Current Medication:  Outpatient Encounter Medications as of 08/01/2019  Medication Sig  . albuterol (PROVENTIL) (2.5 MG/3ML) 0.083% nebulizer solution Take 2.5 mg by nebulization every 6 (six) hours as needed for wheezing or shortness of breath.  . fluticasone (FLONASE) 50 MCG/ACT nasal spray Place into both nostrils daily.  . furosemide (LASIX) 20 MG tablet Take 1 tablet (20 mg total) by mouth daily.  Marland Kitchen ibuprofen (ADVIL) 800 MG tablet Take 1 tablet (800 mg total) by mouth every 8 (eight) hours as needed.  Marland Kitchen ipratropium-albuterol (DUONEB) 0.5-2.5 (3) MG/3ML SOLN Take 3 mLs by nebulization every 6 (six) hours as needed.  Marland Kitchen PROAIR HFA 108 (90 Base) MCG/ACT inhaler Inhale 2 puffs into the lungs every 6 (six) hours as needed for wheezing or shortness of breath.  . [DISCONTINUED] Fluticasone-Salmeterol (ADVAIR) 250-50 MCG/DOSE AEPB Inhale 1 puff into the lungs 2 (two) times daily.  . [DISCONTINUED] PROAIR HFA 108 (90 Base) MCG/ACT inhaler Inhale 2 puffs into the lungs every 6 (six) hours as needed for wheezing or shortness of breath.  . ciprofloxacin (CIPRO) 500 MG tablet Take 1 tablet (500 mg total) by mouth 2 (two) times daily.  Marland Kitchen HYDROcodone-acetaminophen (NORCO/VICODIN) 5-325 MG tablet Take 1 tablet by mouth 3 (three) times daily as  needed for moderate pain.  . metroNIDAZOLE (FLAGYL) 500 MG tablet Take 1 tablet (500 mg total) by mouth 2 (two) times daily.  . [DISCONTINUED] sulfamethoxazole-trimethoprim (BACTRIM DS) 800-160 MG tablet Take 1 tablet by mouth 2 (two) times daily. (Patient not taking: Reported on 08/01/2019)   No facility-administered encounter medications on file as of 08/01/2019.       Medical History: Past Medical History:  Diagnosis Date  . Arthritis   . Asthma   . Diverticulitis 06/2017  . Headache   . Hypersomnia, unspecified   . Mild persistent asthma   . Mixed hyperlipidemia   . Motion sickness   . Primary ovarian failure   . Uncomplicated asthma   . Vitamin D deficiency, unspecified   . Wheezing      Today's Vitals   08/01/19 1337  BP: (!) 146/74  Pulse: 85  Resp: 16  Temp: 97.8 F (36.6 C)  SpO2: 97%  Weight: (!) 305 lb (138.3 kg)  Height: 5\' 2"  (1.575 m)   Body mass index is 55.79 kg/m.  Review of Systems  Constitutional: Positive for fatigue. Negative for chills, fever and unexpected weight change.  HENT: Negative for congestion, postnasal drip, rhinorrhea, sneezing and sore throat.   Respiratory: Negative for cough, chest tightness, shortness of breath and wheezing.   Cardiovascular: Negative for chest pain and palpitations.  Gastrointestinal: Positive for abdominal pain and diarrhea. Negative for blood in stool, constipation, nausea and vomiting.  Musculoskeletal: Negative for arthralgias, back pain,  joint swelling and neck pain.  Skin: Negative for rash.  Neurological: Negative for dizziness, tremors, numbness and headaches.  Hematological: Negative for adenopathy. Does not bruise/bleed easily.  Psychiatric/Behavioral: Negative for behavioral problems (Depression), sleep disturbance and suicidal ideas. The patient is not nervous/anxious.     Physical Exam Vitals signs and nursing note reviewed.  Constitutional:      General: She is not in acute distress.     Appearance: Normal appearance. She is well-developed. She is obese. She is not diaphoretic.  HENT:     Head: Normocephalic and atraumatic.     Mouth/Throat:     Pharynx: No oropharyngeal exudate.  Eyes:     Pupils: Pupils are equal, round, and reactive to light.  Neck:     Musculoskeletal: Normal range of motion and neck supple.     Thyroid: No thyromegaly.     Vascular: No JVD.     Trachea: No tracheal deviation.  Cardiovascular:     Rate and Rhythm: Normal rate and regular rhythm.     Heart sounds: Normal heart sounds. No murmur. No friction rub. No gallop.   Pulmonary:     Effort: Pulmonary effort is normal. No respiratory distress.     Breath sounds: Normal breath sounds. No wheezing or rales.  Chest:     Chest wall: No tenderness.  Abdominal:     General: Bowel sounds are normal.     Palpations: Abdomen is soft.     Tenderness: There is abdominal tenderness. There is guarding.  Musculoskeletal: Normal range of motion.  Lymphadenopathy:     Cervical: No cervical adenopathy.  Skin:    General: Skin is warm and dry.  Neurological:     Mental Status: She is alert and oriented to person, place, and time.     Cranial Nerves: No cranial nerve deficit.  Psychiatric:        Behavior: Behavior normal.        Thought Content: Thought content normal.        Judgment: Judgment normal.    Assessment/Plan: 1. Acute diverticulitis Start cipro and flagyl 500mg  bid for 10 days.  Bland diet recommended. Rest and increase fluids.  - ciprofloxacin (CIPRO) 500 MG tablet; Take 1 tablet (500 mg total) by mouth 2 (two) times daily.  Dispense: 20 tablet; Refill: 0 - metroNIDAZOLE (FLAGYL) 500 MG tablet; Take 1 tablet (500 mg total) by mouth 2 (two) times daily.  Dispense: 20 tablet; Refill: 0  2. Generalized abdominal pain Short term prescription for hydrocodone/APAP 5/325mg  tablets. May be taken up to three times daily as needed for severe pain. Reviewed risks and possible side effects  associated with taking opiates, benzodiazepines and other CNS depressants. Combination of these could cause dizziness and drowsiness. Advised patient not to drive or operate machinery when taking these medications, as patient's and other's life can be at risk and will have consequences. Patient verbalized understanding in this matter. Dependence and abuse for these drugs will be monitored closely. A Controlled substance policy and procedure is on file which allows [facility] to order a urine drug screen test at any visit. Patient understands and agrees with the plan - HYDROcodone-acetaminophen (NORCO/VICODIN) 5-325 MG tablet; Take 1 tablet by mouth 3 (three) times daily as needed for moderate pain.  Dispense: 15 tablet; Refill: 0  3. Wheezing Renewed proair prescription. Use as needed and as prescribed . - PROAIR HFA 108 (90 Base) MCG/ACT inhaler; Inhale 2 puffs into the lungs every 6 (six)  hours as needed for wheezing or shortness of breath.  Dispense: 18 g; Refill: 5  General Counseling: Victoria Castaneda verbalizes understanding of the findings of todays visit and agrees with plan of treatment. I have discussed any further diagnostic evaluation that may be needed or ordered today. We also reviewed her medications today. she has been encouraged to call the office with any questions or concerns that should arise related to todays visit.    Counseling:  This patient was seen by [place] with Dr Lavera Guise as a part of collaborative care agreement    Meds ordered this encounter  Medications  . ciprofloxacin (CIPRO) 500 MG tablet    Sig: Take 1 tablet (500 mg total) by mouth 2 (two) times daily.    Dispense:  20 tablet    Refill:  0    Order Specific Question:   Supervising Provider    Answer:   Lavera Guise [[id number]]  . metroNIDAZOLE (FLAGYL) 500 MG tablet    Sig: Take 1 tablet (500 mg total) by mouth 2 (two) times daily.    Dispense:  20 tablet    Refill:  0     Order Specific Question:   Supervising Provider    Answer:   Lavera Guise [[id number]]  . PROAIR HFA 108 (90 Base) MCG/ACT inhaler    Sig: Inhale 2 puffs into the lungs every 6 (six) hours as needed for wheezing or shortness of breath.    Dispense:  18 g    Refill:  5    Order Specific Question:   Supervising Provider    Answer:   Lavera Guise [[id number]]  . HYDROcodone-acetaminophen (NORCO/VICODIN) 5-325 MG tablet    Sig: Take 1 tablet by mouth 3 (three) times daily as needed for moderate pain.    Dispense:  15 tablet    Refill:  0    Order Specific Question:   Supervising Provider    Answer:   Lavera Guise [[id number]]    Time spent: 25 Minutes

## 2019-08-11 ENCOUNTER — Other Ambulatory Visit: Payer: Self-pay

## 2019-08-11 MED ORDER — FLUTICASONE-SALMETEROL 250-50 MCG/DOSE IN AEPB: 1.0000 | INHALATION_SPRAY | Freq: Two times a day (BID) | RESPIRATORY_TRACT | 3 refills | Status: DC

## 2019-08-17 DIAGNOSIS — K5792 Diverticulitis of intestine, part unspecified, without perforation or abscess without bleeding: Secondary | ICD-10-CM | POA: Insufficient documentation

## 2019-08-17 DIAGNOSIS — R1084 Generalized abdominal pain: Secondary | ICD-10-CM | POA: Insufficient documentation

## 2019-08-17 DIAGNOSIS — K5732 Diverticulitis of large intestine without perforation or abscess without bleeding: Secondary | ICD-10-CM | POA: Insufficient documentation

## 2019-08-18 ENCOUNTER — Other Ambulatory Visit: Payer: Self-pay

## 2019-08-18 MED ORDER — FLUTICASONE-SALMETEROL 250-50 MCG/DOSE IN AEPB: 1.0000 | INHALATION_SPRAY | Freq: Two times a day (BID) | RESPIRATORY_TRACT | 3 refills | Status: DC

## 2019-08-28 ENCOUNTER — Other Ambulatory Visit: Payer: Self-pay | Admitting: Nurse Practitioner

## 2019-08-29 LAB — LIPID PANEL W/O CHOL/HDL RATIO
Cholesterol, Total: 206 mg/dL — ABNORMAL HIGH (ref 100–199)
HDL: 66 mg/dL (ref >39)
LDL Chol Calc (NIH): 127 mg/dL — ABNORMAL HIGH (ref 0–99)
Triglycerides: 70 mg/dL (ref 0–149)
VLDL Cholesterol Cal: 13 mg/dL (ref 5–40)

## 2019-08-29 LAB — COMPREHENSIVE METABOLIC PANEL
ALT: 12 IU/L (ref 0–32)
AST: 15 IU/L (ref 0–40)
Albumin/Globulin Ratio: 1.1 — ABNORMAL LOW (ref 1.2–2.2)
Albumin: 3.9 g/dL (ref 3.8–4.9)
Alkaline Phosphatase: 63 IU/L (ref 39–117)
BUN/Creatinine Ratio: 13 (ref 9–23)
BUN: 8 mg/dL (ref 6–24)
Bilirubin Total: <0.2 mg/dL (ref 0.0–1.2)
CO2: 24 mmol/L (ref 20–29)
Calcium: 9.3 mg/dL (ref 8.7–10.2)
Chloride: 103 mmol/L (ref 96–106)
Creatinine, Ser: 0.64 mg/dL (ref 0.57–1.00)
GFR calc Af Amer: 118 mL/min/{1.73_m2} (ref >59)
GFR calc non Af Amer: 102 mL/min/{1.73_m2} (ref >59)
Globulin, Total: 3.6 g/dL (ref 1.5–4.5)
Glucose: 93 mg/dL (ref 65–99)
Potassium: 4.2 mmol/L (ref 3.5–5.2)
Sodium: 145 mmol/L — ABNORMAL HIGH (ref 134–144)
Total Protein: 7.5 g/dL (ref 6.0–8.5)

## 2019-08-29 LAB — CBC
Hematocrit: 39.1 % (ref 34.0–46.6)
Hemoglobin: 13.1 g/dL (ref 11.1–15.9)
MCH: 28.4 pg (ref 26.6–33.0)
MCHC: 33.5 g/dL (ref 31.5–35.7)
MCV: 85 fL (ref 79–97)
Platelets: 373 10*3/uL (ref 150–450)
RBC: 4.61 x10E6/uL (ref 3.77–5.28)
RDW: 13.2 % (ref 11.7–15.4)
WBC: 7.8 10*3/uL (ref 3.4–10.8)

## 2019-08-29 LAB — T4, FREE: Free T4: 1.28 ng/dL (ref 0.82–1.77)

## 2019-08-29 LAB — VITAMIN D 25 HYDROXY (VIT D DEFICIENCY, FRACTURES): Vit D, 25-Hydroxy: 15 ng/mL — ABNORMAL LOW (ref 30.0–100.0)

## 2019-08-29 LAB — T3: T3, Total: 178 ng/dL (ref 71–180)

## 2019-08-29 LAB — TSH: TSH: 1.85 u[IU]/mL (ref 0.450–4.500)

## 2019-09-02 NOTE — Progress Notes (Signed)
Vitamin d deficiency. Discuss at visit 09/18/2019

## 2019-09-11 ENCOUNTER — Ambulatory Visit: Payer: Managed Care, Other (non HMO) | Admitting: Internal Medicine

## 2019-09-11 ENCOUNTER — Encounter: Payer: Self-pay | Admitting: Internal Medicine

## 2019-09-11 ENCOUNTER — Other Ambulatory Visit: Payer: Self-pay

## 2019-09-11 DIAGNOSIS — J4520 Mild intermittent asthma, uncomplicated: Secondary | ICD-10-CM | POA: Diagnosis not present

## 2019-09-11 DIAGNOSIS — J309 Allergic rhinitis, unspecified: Secondary | ICD-10-CM | POA: Diagnosis not present

## 2019-09-11 DIAGNOSIS — K5732 Diverticulitis of large intestine without perforation or abscess without bleeding: Secondary | ICD-10-CM | POA: Diagnosis not present

## 2019-09-11 MED ORDER — CIPROFLOXACIN HCL 500 MG PO TABS: 500.0000 mg | ORAL_TABLET | Freq: Two times a day (BID) | ORAL | 0 refills | Status: DC

## 2019-09-11 MED ORDER — METRONIDAZOLE 500 MG PO TABS: 500.0000 mg | ORAL_TABLET | Freq: Two times a day (BID) | ORAL | 0 refills | Status: DC

## 2019-09-11 NOTE — Progress Notes (Signed)
[facility] [address] [place], [place] [address]  Internal MEDICINE  Telephone Visit  Patient Name: Victoria Castaneda  [id number]  [id number]  Date of Service: 09/11/2019  I connected with the patient at 11:30 by Virtual/ telephone and verified the patients identity using two identifiers.   I discussed the limitations, risks, security and privacy concerns of performing an evaluation and management service by telephone and the availability of in person appointments. I also discussed with the patient that there may be a patient responsible charge related to the service.  The patient expressed understanding and agrees to proceed.    Chief Complaint  Patient presents with  . Telephone Assessment  . Telephone Screen  . Diverticulitis    last wednesday felt there was a flare up, still having issues, stomach pain, pain using bathroom  . Sinus Problem    poss sinus infection; eyes swollen and pressure in face in the mornings, slight headache   HPI Pt has coercers of acute diverticulitis. She had colonoscopy in 2017 and was found to have diverticulosis of sigmoid colon. Pt had an episode of acute diverticulitis in 2018, CT scan in 2018 was positive for diverticulitis with a small phlegmon or abscess within the wall of the sigmoid colon. No drainable collection is present. Recently she was treated again with Cipro and flagyl, Her symptoms have not resolved, pt has been having abdominal pain and diarrhea with mucus in it  Denies any blood in her stools. C/o sinus congestion   Current Medication: Outpatient Encounter Medications as of 09/11/2019  Medication Sig  . albuterol (PROVENTIL) (2.5 MG/3ML) 0.083% nebulizer solution Take 2.5 mg by nebulization every 6 (six) hours as needed for wheezing or shortness of breath.  . fluticasone (FLONASE) 50 MCG/ACT nasal spray Place into both nostrils daily.  . Fluticasone-Salmeterol (ADVAIR) 250-50 MCG/DOSE AEPB Inhale 1 puff into the lungs 2 (two)  times daily.  . furosemide (LASIX) 20 MG tablet Take 1 tablet (20 mg total) by mouth daily.  Marland Kitchen HYDROcodone-acetaminophen (NORCO/VICODIN) 5-325 MG tablet Take 1 tablet by mouth 3 (three) times daily as needed for moderate pain.  Marland Kitchen ibuprofen (ADVIL) 800 MG tablet Take 1 tablet (800 mg total) by mouth every 8 (eight) hours as needed.  Marland Kitchen ipratropium-albuterol (DUONEB) 0.5-2.5 (3) MG/3ML SOLN Take 3 mLs by nebulization every 6 (six) hours as needed.  . metroNIDAZOLE (FLAGYL) 500 MG tablet Take 1 tablet (500 mg total) by mouth 2 (two) times daily.  Marland Kitchen PROAIR HFA 108 (90 Base) MCG/ACT inhaler Inhale 2 puffs into the lungs every 6 (six) hours as needed for wheezing or shortness of breath.  . [DISCONTINUED] metroNIDAZOLE (FLAGYL) 500 MG tablet Take 1 tablet (500 mg total) by mouth 2 (two) times daily.  . ciprofloxacin (CIPRO) 500 MG tablet Take 1 tablet (500 mg total) by mouth 2 (two) times daily.  . [DISCONTINUED] ciprofloxacin (CIPRO) 500 MG tablet Take 1 tablet (500 mg total) by mouth 2 (two) times daily. (Patient not taking: Reported on 09/11/2019)   No facility-administered encounter medications on file as of 09/11/2019.     Surgical History: Past Surgical History:  Procedure Laterality Date  . ABDOMINAL HYSTERECTOMY    . CHOLECYSTECTOMY    . COLONOSCOPY WITH PROPOFOL N/A 12/10/2015   Procedure: COLONOSCOPY WITH PROPOFOL;  Surgeon: Midge Minium, MD;  [facility];  Service: Endoscopy;  Laterality: N/A;    Medical History: Past Medical History:  Diagnosis Date  . Arthritis   . Asthma   .  Diverticulitis 06/2017  . Headache   . Hypersomnia, unspecified   . Mild persistent asthma   . Mixed hyperlipidemia   . Motion sickness   . Primary ovarian failure   . Uncomplicated asthma   . Vitamin D deficiency, unspecified   . Wheezing     Family History: Family History  Problem Relation Age of Onset  . Diabetes Mother   . Breast cancer Neg Hx     Social History    Socioeconomic History  . Marital status: Single    Spouse name: Not on file  . Number of children: Not on file  . Years of education: Not on file  . Highest education level: Not on file  Occupational History  . Not on file  Social Needs  . Financial resource strain: Not on file  . Food insecurity    Worry: Not on file    Inability: Not on file  . Transportation needs    Medical: Not on file    Non-medical: Not on file  Tobacco Use  . Smoking status: Never Smoker  . Smokeless tobacco: Never Used  Substance and Sexual Activity  . Alcohol use: Yes    Comment: occasionally  . Drug use: No  . Sexual activity: Not on file  Lifestyle  . Physical activity    Days per week: Not on file    Minutes per session: Not on file  . Stress: Not on file  Relationships  . Social Herbalist on phone: Not on file    Gets together: Not on file    Attends religious service: Not on file    Active member of club or organization: Not on file    Attends meetings of clubs or organizations: Not on file    Relationship status: Not on file  . Intimate partner violence    Fear of current or ex partner: Not on file    Emotionally abused: Not on file    Physically abused: Not on file    Forced sexual activity: Not on file  Other Topics Concern  . Not on file  Social History Narrative  . Not on file    Review of Systems  Constitutional: Positive for chills. Negative for fatigue.  HENT: Positive for postnasal drip and sinus pressure.   Eyes: Negative for photophobia, discharge, redness, itching and visual disturbance.  Respiratory: Negative for cough, shortness of breath and wheezing.   Cardiovascular: Negative for chest pain, palpitations and leg swelling.  Gastrointestinal: Positive for abdominal pain and diarrhea. Negative for blood in stool, nausea and vomiting.  Genitourinary: Positive for flank pain.  Musculoskeletal: Negative for arthralgias, gait problem and neck pain.   Skin: Negative for color change.  Allergic/Immunologic: Positive for environmental allergies. Negative for food allergies.  Neurological: Negative for dizziness and headaches.  Hematological: Does not bruise/bleed easily.  Psychiatric/Behavioral: Negative for agitation, behavioral problems (depression) and hallucinations.    Vital Signs: Ht 5\' 4"  (1.626 m)   BMI 52.35 kg/m    Observation/Objective: Pt looks somewhat uncomfortable with sinus congestion   Assessment/Plan: 1. Diverticulitis of large intestine without perforation or abscess without bleeding - This is her second episode within a month, will get a diagnostic study, might need to see GI again as well  - metroNIDAZOLE (FLAGYL) 500 MG tablet; Take 1 tablet (500 mg total) by mouth 2 (two) times daily.  Dispense: 20 tablet; Refill: 0 - ciprofloxacin (CIPRO) 500 MG tablet; Take 1 tablet (500 mg total) by  mouth 2 (two) times daily.  Dispense: 20 tablet; Refill: 0 - CT ABDOMEN W WO CONTRAST; Future  2. Allergic rhinitis, unspecified seasonality, unspecified trigger - Add Flonase OTC   3. Mild intermittent asthma without complication - Controlled with MDI  General Counseling: Vira AgarSheila verbalizes understanding of the findings of today's phone visit and agrees with plan of treatment. I have discussed any further diagnostic evaluation that may be needed or ordered today. We also reviewed her medications today. she has been encouraged to call the office with any questions or concerns that should arise related to todays visit.  Orders Placed This Encounter  Procedures  . CT ABDOMEN W WO CONTRAST    Meds ordered this encounter  Medications  . metroNIDAZOLE (FLAGYL) 500 MG tablet    Sig: Take 1 tablet (500 mg total) by mouth 2 (two) times daily.    Dispense:  20 tablet    Refill:  0  . ciprofloxacin (CIPRO) 500 MG tablet    Sig: Take 1 tablet (500 mg total) by mouth 2 (two) times daily.    Dispense:  20 tablet    Refill:  0     Time spent: 25 Minutes Dr Lyndon CodeFozia M Ashtin Melichar Internal medicine

## 2019-09-12 ENCOUNTER — Other Ambulatory Visit: Payer: Self-pay

## 2019-09-12 ENCOUNTER — Ambulatory Visit
Admission: RE | Admit: 2019-09-12 | Discharge: 2019-09-12 | Disposition: A | Discharge status: Home / Self Care | Payer: Managed Care, Other (non HMO) | Source: Ambulatory Visit | Attending: Internal Medicine | Admitting: Internal Medicine

## 2019-09-12 ENCOUNTER — Other Ambulatory Visit: Payer: Self-pay | Admitting: Internal Medicine

## 2019-09-12 DIAGNOSIS — K5732 Diverticulitis of large intestine without perforation or abscess without bleeding: Secondary | ICD-10-CM

## 2019-09-12 MED ORDER — IOHEXOL 300 MG/ML  SOLN
125.0000 mL | Freq: Once | INTRAMUSCULAR | Status: AC | PRN
  Administered 2019-09-12: 125 mL via INTRAVENOUS

## 2019-09-12 NOTE — Progress Notes (Signed)
[id number] 

## 2019-09-15 ENCOUNTER — Telehealth: Payer: Self-pay

## 2019-09-16 NOTE — Telephone Encounter (Signed)
Pt was informed to keep her appt.

## 2019-09-16 NOTE — Telephone Encounter (Signed)
Can she keep her app, she might need to be referred to GI, CT did show diverticulitis

## 2019-09-17 ENCOUNTER — Telehealth: Payer: Self-pay

## 2019-09-17 NOTE — Telephone Encounter (Signed)
CONFIRMED FOR 09-19-19 OV AS VIRTUAL.

## 2019-09-19 ENCOUNTER — Other Ambulatory Visit: Payer: Self-pay

## 2019-09-19 ENCOUNTER — Encounter: Payer: Self-pay | Admitting: Nurse Practitioner

## 2019-09-19 ENCOUNTER — Ambulatory Visit (INDEPENDENT_AMBULATORY_CARE_PROVIDER_SITE_OTHER): Payer: Managed Care, Other (non HMO) | Admitting: Nurse Practitioner

## 2019-09-19 DIAGNOSIS — J4520 Mild intermittent asthma, uncomplicated: Secondary | ICD-10-CM | POA: Diagnosis not present

## 2019-09-19 DIAGNOSIS — K5792 Diverticulitis of intestine, part unspecified, without perforation or abscess without bleeding: Secondary | ICD-10-CM | POA: Diagnosis not present

## 2019-09-19 DIAGNOSIS — B373 Candidiasis of vulva and vagina: Secondary | ICD-10-CM | POA: Diagnosis not present

## 2019-09-19 DIAGNOSIS — B3731 Acute candidiasis of vulva and vagina: Secondary | ICD-10-CM

## 2019-09-19 MED ORDER — FLUCONAZOLE 150 MG PO TABS: ORAL_TABLET | ORAL | 0 refills | Status: DC

## 2019-09-19 NOTE — Progress Notes (Signed)
[facility] [place], [place] [address]  Internal MEDICINE  Telephone Visit  Patient Name: Victoria Castaneda  [id number]  [id number]  Date of Service: 09/21/2019  I connected with the patient at 4:18pm by webcam and verified the patients identity using two identifiers.   I discussed the limitations, risks, security and privacy concerns of performing an evaluation and management service by webcam and the availability of in person appointments. I also discussed with the patient that there may be a patient responsible charge related to the service.  The patient expressed understanding and agrees to proceed.    Chief Complaint  Patient presents with  . Telephone Screen  . Medical Management of Chronic Issues    6 monh follow up review ct   . Asthma  . Telephone Assessment    The patient has been contacted via webcam for follow up visit due to concerns for spread of novel coronavirus.  She presents for follow up visit. She was seen and treated for acute diverticulitis last week. She was started on prescriptions for both cipro and flagyl, 500mg  twice daily. She has a few more days of these antibiotics left. She did have CT scan of the abdomen. This confirmed the diagnosis of diverticulitis of distal sigmoid colon. The patient states that she is feeling much better today. She has tolerated the antibiotics well. She does believe that she has developed a yeast infection. She has some vaginal itching and irritation.       Current Medication: Outpatient Encounter Medications as of 09/19/2019  Medication Sig  . albuterol (PROVENTIL) (2.5 MG/3ML) 0.083% nebulizer solution Take 2.5 mg by nebulization every 6 (six) hours as needed for wheezing or shortness of breath.  . ciprofloxacin (CIPRO) 500 MG tablet Take 1 tablet (500 mg total) by mouth 2 (two) times daily.  . fluticasone (FLONASE) 50 MCG/ACT nasal spray Place into both nostrils daily.  . Fluticasone-Salmeterol (ADVAIR)  250-50 MCG/DOSE AEPB Inhale 1 puff into the lungs 2 (two) times daily.  . furosemide (LASIX) 20 MG tablet Take 1 tablet (20 mg total) by mouth daily.  Marland Kitchen HYDROcodone-acetaminophen (NORCO/VICODIN) 5-325 MG tablet Take 1 tablet by mouth 3 (three) times daily as needed for moderate pain.  Marland Kitchen ibuprofen (ADVIL) 800 MG tablet Take 1 tablet (800 mg total) by mouth every 8 (eight) hours as needed.  Marland Kitchen ipratropium-albuterol (DUONEB) 0.5-2.5 (3) MG/3ML SOLN Take 3 mLs by nebulization every 6 (six) hours as needed.  . metroNIDAZOLE (FLAGYL) 500 MG tablet Take 1 tablet (500 mg total) by mouth 2 (two) times daily.  Marland Kitchen PROAIR HFA 108 (90 Base) MCG/ACT inhaler Inhale 2 puffs into the lungs every 6 (six) hours as needed for wheezing or shortness of breath.  . fluconazole (DIFLUCAN) 150 MG tablet Take 1 tablet po once. May repeat dose in 3 days as needed for persistent symptoms.   No facility-administered encounter medications on file as of 09/19/2019.    Surgical History: Past Surgical History:  Procedure Laterality Date  . ABDOMINAL HYSTERECTOMY    . CHOLECYSTECTOMY    . COLONOSCOPY WITH PROPOFOL N/A 12/10/2015   Procedure: COLONOSCOPY WITH PROPOFOL;  Surgeon: Lucilla Lame, MD;  Location: [facility];  Service: Endoscopy;  Laterality: N/A;    Medical History: Past Medical History:  Diagnosis Date  . Arthritis   . Asthma   . Diverticulitis 06/2017  . Headache   . Hypersomnia, unspecified   . Mild persistent asthma   . Mixed hyperlipidemia   . Motion  sickness   . Primary ovarian failure   . Uncomplicated asthma   . Vitamin D deficiency, unspecified   . Wheezing     Family History: Family History  Problem Relation Age of Onset  . Diabetes Mother   . Breast cancer Neg Hx     Social History   Socioeconomic History  . Marital status: Single    Spouse name: Not on file  . Number of children: Not on file  . Years of education: Not on file  . Highest education level: Not on file   Occupational History  . Not on file  Tobacco Use  . Smoking status: Never Smoker  . Smokeless tobacco: Never Used  Substance and Sexual Activity  . Alcohol use: Yes    Comment: occasionally  . Drug use: No  . Sexual activity: Not on file  Other Topics Concern  . Not on file  Social History Narrative  . Not on file   Social Determinants of Health   Financial Resource Strain:   . Difficulty of Paying Living Expenses: Not on file  Food Insecurity:   . Worried About Programme researcher, broadcasting/film/video in the Last Year: Not on file  . Ran Out of Food in the Last Year: Not on file  Transportation Needs:   . Lack of Transportation (Medical): Not on file  . Lack of Transportation (Non-Medical): Not on file  Physical Activity:   . Days of Exercise per Week: Not on file  . Minutes of Exercise per Session: Not on file  Stress:   . Feeling of Stress : Not on file  Social Connections:   . Frequency of Communication with Friends and Family: Not on file  . Frequency of Social Gatherings with Friends and Family: Not on file  . Attends Religious Services: Not on file  . Active Member of Clubs or Organizations: Not on file  . Attends Banker Meetings: Not on file  . Marital Status: Not on file  Intimate Partner Violence:   . Fear of Current or Ex-Partner: Not on file  . Emotionally Abused: Not on file  . Physically Abused: Not on file  . Sexually Abused: Not on file      Review of Systems  Constitutional: Negative for activity change, chills, fatigue and unexpected weight change.  HENT: Negative for congestion, postnasal drip, rhinorrhea, sneezing and sore throat.   Respiratory: Negative for cough, chest tightness and shortness of breath.   Cardiovascular: Negative for chest pain and palpitations.  Gastrointestinal: Negative for abdominal pain, constipation, diarrhea, nausea and vomiting.       Abdominal pain with diarrhea, present at her last visit has resolved.   Endocrine:  Negative for cold intolerance, heat intolerance, polydipsia and polyuria.  Genitourinary: Positive for vaginal discharge. Negative for dysuria and frequency.       Vaginal itching and irritation.   Musculoskeletal: Negative for arthralgias, back pain, joint swelling and neck pain.  Skin: Negative for rash.  Allergic/Immunologic: Negative for environmental allergies.  Neurological: Negative for dizziness, tremors, numbness and headaches.  Hematological: Negative for adenopathy. Does not bruise/bleed easily.  Psychiatric/Behavioral: Negative for behavioral problems (Depression), sleep disturbance and suicidal ideas. The patient is not nervous/anxious.     Vital Signs: There were no vitals taken for this visit.   Observation/Objective:    The patient is alert and oriented. She is pleasant and answers all questions appropriately. Breathing is non-labored. She is in no acute distress at this time.   Assessment/Plan:  1. Acute diverticulitis Reviewed CT scan report with the patient. Did show acute diverticulitis. She is still taking antibiotics. Symptoms have resolved. Will continue to monitor.   2. Vaginal yeast infection Diflucan 150mg  may be taken once. Repeat in three days for persistent symptoms.  - fluconazole (DIFLUCAN) 150 MG tablet; Take 1 tablet po once. May repeat dose in 3 days as needed for persistent symptoms.  Dispense: 3 tablet; Refill: 0  3. Mild intermittent asthma without complication Stable. Continue bp medication as prescribed   General Counseling: Victoria Castaneda verbalizes understanding of the findings of today's phone visit and agrees with plan of treatment. I have discussed any further diagnostic evaluation that may be needed or ordered today. We also reviewed her medications today. she has been encouraged to call the office with any questions or concerns that should arise related to todays visit.   This patient was seen by Vincent GrosHeather Jakira Mcfadden FNP Collaboration with Dr Lyndon CodeFozia M  Khan as a part of collaborative care agreement  Meds ordered this encounter  Medications  . fluconazole (DIFLUCAN) 150 MG tablet    Sig: Take 1 tablet po once. May repeat dose in 3 days as needed for persistent symptoms.    Dispense:  3 tablet    Refill:  0    Order Specific Question:   Supervising Provider    Answer:   Lyndon CodeKHAN, FOZIA M [[id number]]    Time spent: 7515 Minutes    Dr Lyndon CodeFozia M Khan Internal medicine

## 2019-09-21 DIAGNOSIS — B3731 Acute candidiasis of vulva and vagina: Secondary | ICD-10-CM | POA: Insufficient documentation

## 2019-09-21 DIAGNOSIS — B373 Candidiasis of vulva and vagina: Secondary | ICD-10-CM | POA: Insufficient documentation

## 2019-10-13 ENCOUNTER — Telehealth: Payer: Self-pay

## 2019-10-14 ENCOUNTER — Other Ambulatory Visit: Payer: Self-pay | Admitting: Nurse Practitioner

## 2019-10-14 DIAGNOSIS — E559 Vitamin D deficiency, unspecified: Secondary | ICD-10-CM

## 2019-10-14 MED ORDER — ERGOCALCIFEROL 1.25 MG (50000 UT) PO CAPS: 50000.0000 [IU] | ORAL_CAPSULE | ORAL | 5 refills | Status: DC

## 2019-10-14 NOTE — Progress Notes (Signed)
vitamin d deficiency present on labs. Sent prescription for drisdol 10404 iu weekly to her pharmacy. Total cholesterol and LDL are slightly elevated. She should limit intake of fired and fatty foods and increase exercise. Other labs were good.

## 2019-10-14 NOTE — Telephone Encounter (Signed)
Pt advised for labs see other note  

## 2019-10-14 NOTE — Telephone Encounter (Signed)
Please let the patient know that vitamin d deficiency present on labs. Sent prescription for drisdol 03500 iu weekly to her pharmacy. Total cholesterol and LDL are slightly elevated. She should limit intake of fired and fatty foods and increase exercise. Other labs were good.  Thanks.

## 2019-11-19 ENCOUNTER — Other Ambulatory Visit: Payer: Self-pay

## 2019-11-19 MED ORDER — FLUTICASONE-SALMETEROL 250-50 MCG/DOSE IN AEPB: 1.0000 | INHALATION_SPRAY | Freq: Two times a day (BID) | RESPIRATORY_TRACT | 1 refills | Status: DC

## 2020-01-14 ENCOUNTER — Other Ambulatory Visit: Payer: Self-pay | Admitting: Internal Medicine

## 2020-02-20 ENCOUNTER — Ambulatory Visit: Payer: Managed Care, Other (non HMO) | Admitting: Nurse Practitioner

## 2020-03-18 ENCOUNTER — Telehealth: Payer: Self-pay

## 2020-03-18 NOTE — Telephone Encounter (Signed)
Confirmed and screened for 03-22-20 ov. 

## 2020-03-22 ENCOUNTER — Encounter: Payer: Self-pay | Admitting: Nurse Practitioner

## 2020-03-22 ENCOUNTER — Other Ambulatory Visit: Payer: Self-pay

## 2020-03-22 ENCOUNTER — Ambulatory Visit (INDEPENDENT_AMBULATORY_CARE_PROVIDER_SITE_OTHER): Payer: Managed Care, Other (non HMO) | Admitting: Nurse Practitioner

## 2020-03-22 VITALS — BP 140/73 | HR 94 | Temp 97.4°F | Resp 16 | Ht 64.0 in | Wt 312.2 lb

## 2020-03-22 DIAGNOSIS — Z0001 Encounter for general adult medical examination with abnormal findings: Secondary | ICD-10-CM | POA: Diagnosis not present

## 2020-03-22 DIAGNOSIS — B3731 Acute candidiasis of vulva and vagina: Secondary | ICD-10-CM

## 2020-03-22 DIAGNOSIS — J0141 Acute recurrent pansinusitis: Secondary | ICD-10-CM

## 2020-03-22 DIAGNOSIS — R300 Dysuria: Secondary | ICD-10-CM

## 2020-03-22 DIAGNOSIS — N390 Urinary tract infection, site not specified: Secondary | ICD-10-CM | POA: Diagnosis not present

## 2020-03-22 DIAGNOSIS — J4520 Mild intermittent asthma, uncomplicated: Secondary | ICD-10-CM

## 2020-03-22 DIAGNOSIS — B373 Candidiasis of vulva and vagina: Secondary | ICD-10-CM

## 2020-03-22 DIAGNOSIS — Z1231 Encounter for screening mammogram for malignant neoplasm of breast: Secondary | ICD-10-CM

## 2020-03-22 LAB — POCT URINALYSIS DIPSTICK
Blood, UA: NEGATIVE
Glucose, UA: NEGATIVE
Nitrite, UA: NEGATIVE
Protein, UA: NEGATIVE
Spec Grav, UA: 1.02 (ref 1.010–1.025)
Urobilinogen, UA: 0.2 E.U./dL
pH, UA: 5 (ref 5.0–8.0)

## 2020-03-22 MED ORDER — SULFAMETHOXAZOLE-TRIMETHOPRIM 800-160 MG PO TABS: 1.0000 | ORAL_TABLET | Freq: Two times a day (BID) | ORAL | 0 refills | Status: DC

## 2020-03-22 MED ORDER — PHENAZOPYRIDINE HCL 200 MG PO TABS: 200.0000 mg | ORAL_TABLET | Freq: Three times a day (TID) | ORAL | 0 refills | Status: DC | PRN

## 2020-03-22 MED ORDER — FLUCONAZOLE 150 MG PO TABS: ORAL_TABLET | ORAL | 0 refills | Status: DC

## 2020-03-22 NOTE — Progress Notes (Signed)
[facility] [place], [place] [address]  Internal MEDICINE  Office Visit Note  Patient Name: Victoria Castaneda  [id number]  [id number]  Date of Service: 03/22/2020   Pt is here for routine health maintenance examination   Chief Complaint  Patient presents with  . Urinary Tract Infection    Pt feels like she has a UTI; completed urine sample  . Nasal Congestion     The patient presents for annual wellness visit. Today, she c/o nasal congestion, sinus headache, and scratchy throat. Symptoms have been present for two weeks. She denies fever, chills, or body aches.  She also has some flank pain and urinary frequency. This has been going on for a little longer than the sinus problems.  She is due for screening mammogram. She will be due to have routine, fasting labs closer to her next visit. She is due to have wellness visit through her employer. She will have some of her labs drawn through this visit at employer. They will have her consult with me regarding weight related issues. Due to recent quarantine and reduced physical activity due to COVID 19, she has gained weight. She feels as though she is at the heaviest she has ever been. We have discussed limiting calorie intake to 1500 calories per day. We also discussed comprising most of her calorie intake to mostly freshe furits, vegetables, and lean meats. She should also increase level of activity.   Current Medication: Outpatient Encounter Medications as of 03/22/2020  Medication Sig  . albuterol (PROVENTIL) (2.5 MG/3ML) 0.083% nebulizer solution Take 2.5 mg by nebulization every 6 (six) hours as needed for wheezing or shortness of breath.  . ciprofloxacin (CIPRO) 500 MG tablet Take 1 tablet (500 mg total) by mouth 2 (two) times daily.  . ergocalciferol (DRISDOL) 1.25 MG (50000 UT) capsule Take 1 capsule (50,000 Units total) by mouth once a week.  . fluconazole (DIFLUCAN) 150 MG tablet Take 1 tablet po once. May  repeat dose in 3 days as needed for persistent symptoms.  . fluticasone (FLONASE) 50 MCG/ACT nasal spray Place into both nostrils daily.  . Fluticasone-Salmeterol (ADVAIR) 250-50 MCG/DOSE AEPB Inhale 1 puff into the lungs 2 (two) times daily.  . furosemide (LASIX) 20 MG tablet Take 1 tablet by mouth once daily  . HYDROcodone-acetaminophen (NORCO/VICODIN) 5-325 MG tablet Take 1 tablet by mouth 3 (three) times daily as needed for moderate pain.  Marland Kitchen ibuprofen (ADVIL) 800 MG tablet Take 1 tablet (800 mg total) by mouth every 8 (eight) hours as needed.  Marland Kitchen ipratropium-albuterol (DUONEB) 0.5-2.5 (3) MG/3ML SOLN Take 3 mLs by nebulization every 6 (six) hours as needed.  . metroNIDAZOLE (FLAGYL) 500 MG tablet Take 1 tablet (500 mg total) by mouth 2 (two) times daily.  Marland Kitchen PROAIR HFA 108 (90 Base) MCG/ACT inhaler Inhale 2 puffs into the lungs every 6 (six) hours as needed for wheezing or shortness of breath.  . [DISCONTINUED] fluconazole (DIFLUCAN) 150 MG tablet Take 1 tablet po once. May repeat dose in 3 days as needed for persistent symptoms.  . phenazopyridine (PYRIDIUM) 200 MG tablet Take 1 tablet (200 mg total) by mouth 3 (three) times daily as needed for pain.  Marland Kitchen sulfamethoxazole-trimethoprim (BACTRIM DS) 800-160 MG tablet Take 1 tablet by mouth 2 (two) times daily.   No facility-administered encounter medications on file as of 03/22/2020.    Surgical History: Past Surgical History:  Procedure Laterality Date  . ABDOMINAL HYSTERECTOMY    . CHOLECYSTECTOMY    .  COLONOSCOPY WITH PROPOFOL N/A 12/10/2015   Procedure: COLONOSCOPY WITH PROPOFOL;  Surgeon: Midge Minium, MD;  Location: [facility];  Service: Endoscopy;  Laterality: N/A;    Medical History: Past Medical History:  Diagnosis Date  . Arthritis   . Asthma   . Diverticulitis 06/2017  . Headache   . Hypersomnia, unspecified   . Mild persistent asthma   . Mixed hyperlipidemia   . Motion sickness   . Primary ovarian failure   .  Uncomplicated asthma   . Vitamin D deficiency, unspecified   . Wheezing     Family History: Family History  Problem Relation Age of Onset  . Diabetes Mother   . Breast cancer Neg Hx       Review of Systems  Constitutional: Negative for chills, fatigue and unexpected weight change.       Seven pound weight gain since her last in-office visit.   HENT: Positive for congestion, postnasal drip, rhinorrhea, sinus pressure and sinus pain. Negative for sneezing and sore throat.   Respiratory: Negative for cough, chest tightness, shortness of breath and wheezing.   Cardiovascular: Negative for chest pain and palpitations.  Gastrointestinal: Negative for abdominal pain, constipation, diarrhea, nausea and vomiting.  Endocrine: Negative for cold intolerance, heat intolerance, polydipsia and polyuria.  Genitourinary: Positive for dysuria, frequency and urgency.  Musculoskeletal: Negative for arthralgias, back pain, joint swelling and neck pain.  Skin: Negative for rash.  Allergic/Immunologic: Negative for environmental allergies.  Neurological: Negative for dizziness, tremors, numbness and headaches.  Hematological: Negative for adenopathy. Does not bruise/bleed easily.  Psychiatric/Behavioral: Negative for behavioral problems (Depression), sleep disturbance and suicidal ideas. The patient is not nervous/anxious.     Today's Vitals   03/22/20 0940  BP: 140/73  Pulse: 94  Resp: 16  Temp: (!) 97.4 F (36.3 C)  SpO2: 95%  Weight: (!) 312 lb 3.2 oz (141.6 kg)  Height: 5\' 4"  (1.626 m)   Body mass index is 53.59 kg/m.  Physical Exam Vitals and nursing note reviewed.  Constitutional:      General: She is not in acute distress.    Appearance: Normal appearance. She is well-developed. She is obese. She is not diaphoretic.  HENT:     Head: Normocephalic and atraumatic.     Right Ear: Tympanic membrane is erythematous.     Left Ear: Tympanic membrane is erythematous.     Nose:  Congestion present.     Right Sinus: Frontal sinus tenderness present.     Left Sinus: Frontal sinus tenderness present.     Mouth/Throat:     Pharynx: No oropharyngeal exudate.  Eyes:     Pupils: Pupils are equal, round, and reactive to light.  Neck:     Thyroid: No thyromegaly.     Vascular: No carotid bruit or JVD.     Trachea: No tracheal deviation.  Cardiovascular:     Rate and Rhythm: Normal rate and regular rhythm.     Pulses: Normal pulses.     Heart sounds: Normal heart sounds. No murmur heard.  No friction rub. No gallop.   Pulmonary:     Effort: Pulmonary effort is normal. No respiratory distress.     Breath sounds: Normal breath sounds. No wheezing or rales.  Chest:     Chest wall: No tenderness.  Abdominal:     General: Bowel sounds are normal.     Palpations: Abdomen is soft.     Tenderness: There is no abdominal tenderness.  Genitourinary:  Comments: Urine sample positive for small WBC and trace ketones  Musculoskeletal:        General: Normal range of motion.     Cervical back: Normal range of motion and neck supple.  Lymphadenopathy:     Cervical: No cervical adenopathy.  Skin:    General: Skin is warm and dry.  Neurological:     Mental Status: She is alert and oriented to person, place, and time.     Cranial Nerves: No cranial nerve deficit.  Psychiatric:        Mood and Affect: Mood normal.        Behavior: Behavior normal.        Thought Content: Thought content normal.        Judgment: Judgment normal.      LABS: Recent Results (from the past 2160 hour(s))  POCT Urinalysis Dipstick     Status: Abnormal   Collection Time: 03/22/20 10:01 AM  Result Value Ref Range   Color, UA     Clarity, UA     Glucose, UA Negative Negative   Bilirubin, UA Moderate    Ketones, UA Trace    Spec Grav, UA 1.020 1.010 - 1.025   Blood, UA Negative    pH, UA 5.0 5.0 - 8.0   Protein, UA Negative Negative   Urobilinogen, UA 0.2 0.2 or 1.0 E.U./dL   Nitrite,  UA Negative    Leukocytes, UA Small (1+) (A) Negative   Appearance     Odor      Assessment/Plan: 1. Encounter for general adult medical examination with abnormal findings Annual health maintenance exam today.   2. Urinary tract infection without hematuria, site unspecified Urine sample positive for small WBC and trace ketones. Treat with bactrim DS twice daily for 10 days. Will send urine for culture and sensitivity and adjust antibiotics as indicated.   3. Dysuria Add pyridium 200mg  twice daily as needed for blader pain and spasms.  - POCT Urinalysis Dipstick - phenazopyridine (PYRIDIUM) 200 MG tablet; Take 1 tablet (200 mg total) by mouth 3 (three) times daily as needed for pain.  Dispense: 10 tablet; Refill: 0  4. Acute recurrent pansinusitis Bactrim DS bid for 10 days. Rest and increase fluids. Use OTC medications as needed to treat acute symptoms.  - sulfamethoxazole-trimethoprim (BACTRIM DS) 800-160 MG tablet; Take 1 tablet by mouth 2 (two) times daily.  Dispense: 20 tablet; Refill: 0  5. Vaginal yeast infection Diflucan should be taken as needed if yeast infection develops.  - fluconazole (DIFLUCAN) 150 MG tablet; Take 1 tablet po once. May repeat dose in 3 days as needed for persistent symptoms.  Dispense: 3 tablet; Refill: 0  6. Mild intermittent asthma without complication Well managed. Continue inhalers and respiratory medications as needed and as prescribed.   7. Morbid obesity (HCC) Discussion regarding weight related issues. Due to recent quarantine and reduced physical activity due to COVID 19, she has gained weight. She feels as though she is at the heaviest she has ever been. We have discussed limiting calorie intake to 1500 calories per day. We also discussed comprising most of her calorie intake to mostly fresh furits, vegetables, and lean meats. She should also increase level of activity. She understands and agrees with this plan and will follow up routinely for  further evaluation and treatment as indicated.   8. Encounter for screening mammogram for malignant neoplasm of breast - MM 3D SCREEN BREAST BILATERAL; Future  General Counseling: chaunta bejarano understanding of the  findings of todays visit and agrees with plan of treatment. I have discussed any further diagnostic evaluation that may be needed or ordered today. We also reviewed her medications today. she has been encouraged to call the office with any questions or concerns that should arise related to todays visit.    Counseling:   There is a liability release in patients' chart. There has been a 10 minute discussion about the side effects including but not limited to elevated blood pressure, anxiety, lack of sleep and dry mouth. Pt understands and will like to start/continue on appetite suppressant at this time. There will be one month RX given at the time of visit with proper follow up. Nova diet plan with restricted calories is given to the pt. Pt understands and agrees with  plan of treatment  This patient was seen by Vincent Gros FNP Collaboration with Dr Lyndon Code as a part of collaborative care agreement  Orders Placed This Encounter  Procedures  . MM 3D SCREEN BREAST BILATERAL  . POCT Urinalysis Dipstick    Meds ordered this encounter  Medications  . sulfamethoxazole-trimethoprim (BACTRIM DS) 800-160 MG tablet    Sig: Take 1 tablet by mouth 2 (two) times daily.    Dispense:  20 tablet    Refill:  0    Order Specific Question:   Supervising Provider    Answer:   Lyndon Code [[id number]]  . fluconazole (DIFLUCAN) 150 MG tablet    Sig: Take 1 tablet po once. May repeat dose in 3 days as needed for persistent symptoms.    Dispense:  3 tablet    Refill:  0    Order Specific Question:   Supervising Provider    Answer:   Lyndon Code [[id number]]  . phenazopyridine (PYRIDIUM) 200 MG tablet    Sig: Take 1 tablet (200 mg total) by mouth 3 (three) times daily as needed for pain.     Dispense:  10 tablet    Refill:  0    Order Specific Question:   Supervising Provider    Answer:   Lyndon Code [[id number]]    Total time spent: 45 Minutes  Time spent includes review of chart, medications, test results, and follow up plan with the patient.     Lyndon Code, MD  Internal Medicine

## 2020-03-23 ENCOUNTER — Ambulatory Visit
Admission: RE | Admit: 2020-03-23 | Discharge: 2020-03-23 | Disposition: A | Discharge status: Home / Self Care | Payer: Managed Care, Other (non HMO) | Source: Ambulatory Visit | Attending: Nurse Practitioner | Admitting: Nurse Practitioner

## 2020-03-23 ENCOUNTER — Other Ambulatory Visit: Payer: Self-pay

## 2020-03-23 DIAGNOSIS — Z1231 Encounter for screening mammogram for malignant neoplasm of breast: Secondary | ICD-10-CM

## 2020-04-13 ENCOUNTER — Other Ambulatory Visit: Payer: 59

## 2020-04-19 ENCOUNTER — Other Ambulatory Visit: Payer: Self-pay

## 2020-04-19 MED ORDER — FUROSEMIDE 20 MG PO TABS: 20.0000 mg | ORAL_TABLET | Freq: Every day | ORAL | 1 refills | Status: DC

## 2020-05-26 ENCOUNTER — Telehealth: Payer: Self-pay

## 2020-05-26 NOTE — Telephone Encounter (Signed)
Wellness screeing appeal completed and faxed back to patient at [phone]. Copy placed in scan.

## 2020-05-31 ENCOUNTER — Other Ambulatory Visit: Payer: Self-pay

## 2020-05-31 ENCOUNTER — Ambulatory Visit (INDEPENDENT_AMBULATORY_CARE_PROVIDER_SITE_OTHER): Payer: Managed Care, Other (non HMO) | Admitting: Nurse Practitioner

## 2020-05-31 ENCOUNTER — Encounter: Payer: Self-pay | Admitting: Nurse Practitioner

## 2020-05-31 VITALS — BP 152/65 | HR 86 | Temp 97.4°F | Resp 16 | Ht 64.0 in | Wt 311.0 lb

## 2020-05-31 DIAGNOSIS — K5732 Diverticulitis of large intestine without perforation or abscess without bleeding: Secondary | ICD-10-CM

## 2020-05-31 DIAGNOSIS — J4530 Mild persistent asthma, uncomplicated: Secondary | ICD-10-CM

## 2020-05-31 DIAGNOSIS — B373 Candidiasis of vulva and vagina: Secondary | ICD-10-CM | POA: Diagnosis not present

## 2020-05-31 DIAGNOSIS — B3731 Acute candidiasis of vulva and vagina: Secondary | ICD-10-CM

## 2020-05-31 MED ORDER — METRONIDAZOLE 500 MG PO TABS: 500.0000 mg | ORAL_TABLET | Freq: Two times a day (BID) | ORAL | 0 refills | Status: DC

## 2020-05-31 MED ORDER — FLUTICASONE-SALMETEROL 250-50 MCG/DOSE IN AEPB: 1.0000 | INHALATION_SPRAY | Freq: Two times a day (BID) | RESPIRATORY_TRACT | 1 refills | Status: DC

## 2020-05-31 MED ORDER — CIPROFLOXACIN HCL 500 MG PO TABS: 500.0000 mg | ORAL_TABLET | Freq: Two times a day (BID) | ORAL | 0 refills | Status: DC

## 2020-05-31 MED ORDER — FLUCONAZOLE 150 MG PO TABS: ORAL_TABLET | ORAL | 0 refills | Status: DC

## 2020-05-31 NOTE — Progress Notes (Signed)
[facility] [address] [place], [place] [address]  Internal MEDICINE  Office Visit Note  Patient Name: Victoria Castaneda  [id number]  [id number]  Date of Service: 05/31/2020  Chief Complaint  Patient presents with  . Acute Visit  . Hyperlipidemia  . Quality Metric Gaps    TDAP, HIV screening     The patient is here for acute visit. Victoria Castaneda states that Victoria Castaneda has started with flare of diverticulitis. Victoria Castaneda is having severe lower abdominal cramping. When Victoria Castaneda gets cramping, Victoria Castaneda has passage of loose stools along with clear mucus. Last night, Victoria Castaneda noted some speckles of blood after having bowel movement. Victoria Castaneda states that Victoria Castaneda did have some nausea without vomiting. Victoria Castaneda denies fever or chills. Victoria Castaneda states that Victoria Castaneda appetite is very decreased. States that eating makes the cramping and abdominal pain worse.   Pt is here for a sick visit.     Current Medication:  Outpatient Encounter Medications as of 05/31/2020  Medication Sig  . albuterol (PROVENTIL) (2.5 MG/3ML) 0.083% nebulizer solution Take 2.5 mg by nebulization every 6 (six) hours as needed for wheezing or shortness of breath.  . fluticasone (FLONASE) 50 MCG/ACT nasal spray Place into both nostrils daily.  . Fluticasone-Salmeterol (ADVAIR) 250-50 MCG/DOSE AEPB Inhale 1 puff into the lungs 2 (two) times daily.  . furosemide (LASIX) 20 MG tablet Take 1 tablet (20 mg total) by mouth daily.  Marland Kitchen ibuprofen (ADVIL) 800 MG tablet Take 1 tablet (800 mg total) by mouth every 8 (eight) hours as needed.  Marland Kitchen ipratropium-albuterol (DUONEB) 0.5-2.5 (3) MG/3ML SOLN Take 3 mLs by nebulization every 6 (six) hours as needed.  Marland Kitchen PROAIR HFA 108 (90 Base) MCG/ACT inhaler Inhale 2 puffs into the lungs every 6 (six) hours as needed for wheezing or shortness of breath.  . [DISCONTINUED] Fluticasone-Salmeterol (ADVAIR) 250-50 MCG/DOSE AEPB Inhale 1 puff into the lungs 2 (two) times daily.  . ciprofloxacin (CIPRO) 500 MG tablet Take 1 tablet (500 mg total) by mouth  2 (two) times daily.  . ergocalciferol (DRISDOL) 1.25 MG (50000 UT) capsule Take 1 capsule (50,000 Units total) by mouth once a week. (Patient not taking: Reported on 05/31/2020)  . fluconazole (DIFLUCAN) 150 MG tablet Take 1 tablet po once. May repeat dose in 3 days as needed for persistent symptoms.  Marland Kitchen HYDROcodone-acetaminophen (NORCO/VICODIN) 5-325 MG tablet Take 1 tablet by mouth 3 (three) times daily as needed for moderate pain. (Patient not taking: Reported on 05/31/2020)  . metroNIDAZOLE (FLAGYL) 500 MG tablet Take 1 tablet (500 mg total) by mouth 2 (two) times daily.  . phenazopyridine (PYRIDIUM) 200 MG tablet Take 1 tablet (200 mg total) by mouth 3 (three) times daily as needed for pain. (Patient not taking: Reported on 05/31/2020)  . sulfamethoxazole-trimethoprim (BACTRIM DS) 800-160 MG tablet Take 1 tablet by mouth 2 (two) times daily. (Patient not taking: Reported on 05/31/2020)  . [DISCONTINUED] ciprofloxacin (CIPRO) 500 MG tablet Take 1 tablet (500 mg total) by mouth 2 (two) times daily. (Patient not taking: Reported on 05/31/2020)  . [DISCONTINUED] fluconazole (DIFLUCAN) 150 MG tablet Take 1 tablet po once. May repeat dose in 3 days as needed for persistent symptoms. (Patient not taking: Reported on 05/31/2020)  . [DISCONTINUED] metroNIDAZOLE (FLAGYL) 500 MG tablet Take 1 tablet (500 mg total) by mouth 2 (two) times daily. (Patient not taking: Reported on 05/31/2020)   No facility-administered encounter medications on file as of 05/31/2020.      Medical History: Past Medical History:  Diagnosis Date  .  Arthritis   . Asthma   . Diverticulitis 06/2017  . Headache   . Hypersomnia, unspecified   . Mild persistent asthma   . Mixed hyperlipidemia   . Motion sickness   . Primary ovarian failure   . Uncomplicated asthma   . Vitamin D deficiency, unspecified   . Wheezing      Today's Vitals   05/31/20 1347  BP: (!) 152/65  Pulse: 86  Resp: 16  Temp: (!) 97.4 F (36.3 C)  SpO2:  95%  Weight: (!) 311 lb (141.1 kg)  Height: 5\' 4"  (1.626 m)   Body mass index is 53.38 kg/m.  Review of Systems  Constitutional: Positive for appetite change, chills and fatigue.  HENT: Negative for postnasal drip and sinus pressure.   Respiratory: Negative for cough, shortness of breath and wheezing.   Cardiovascular: Negative for chest pain, palpitations and leg swelling.  Gastrointestinal: Positive for abdominal pain, diarrhea and nausea. Negative for blood in stool and vomiting.  Genitourinary: Negative for dysuria, flank pain and urgency.  Musculoskeletal: Negative for arthralgias, gait problem and neck pain.  Skin: Negative for color change.  Allergic/Immunologic: Positive for environmental allergies. Negative for food allergies.  Neurological: Negative for dizziness and headaches.  Hematological: Does not bruise/bleed easily.  Psychiatric/Behavioral: Negative for agitation, behavioral problems (depression) and hallucinations.    Physical Exam Vitals and nursing note reviewed.  Constitutional:      General: Victoria Castaneda is not in acute distress.    Appearance: Normal appearance. Victoria Castaneda is well-developed. Victoria Castaneda is obese. Victoria Castaneda is not diaphoretic.  HENT:     Head: Normocephalic and atraumatic.     Mouth/Throat:     Pharynx: No oropharyngeal exudate.  Eyes:     Pupils: Pupils are equal, round, and reactive to light.  Neck:     Thyroid: No thyromegaly.     Vascular: No JVD.     Trachea: No tracheal deviation.  Cardiovascular:     Rate and Rhythm: Normal rate and regular rhythm.     Heart sounds: Normal heart sounds. No murmur heard.  No friction rub. No gallop.   Pulmonary:     Effort: Pulmonary effort is normal. No respiratory distress.     Breath sounds: Normal breath sounds. No wheezing or rales.  Chest:     Chest wall: No tenderness.  Abdominal:     General: Bowel sounds are normal.     Palpations: Abdomen is soft.     Tenderness: There is abdominal tenderness.     Comments:  Mild, generalized abdominal tenderness, especially lower quadrants of the abdomen.   Musculoskeletal:        General: Tenderness present. Normal range of motion.     Cervical back: Normal range of motion and neck supple.     Comments: Mild lower back tenderness present.   Lymphadenopathy:     Cervical: No cervical adenopathy.  Skin:    General: Skin is warm and dry.  Neurological:     Mental Status: Victoria Castaneda is alert and oriented to person, place, and time.     Cranial Nerves: No cranial nerve deficit.  Psychiatric:        Mood and Affect: Mood normal.        Behavior: Behavior normal.        Thought Content: Thought content normal.        Judgment: Judgment normal.   Assessment/Plan: 1. Diverticulitis of large intestine without perforation or abscess without bleeding Treat with ciprofloxacin and metronidazole twice daily  for next 10 days. Recommend addition of probiotic while on dual antibiotics.  - ciprofloxacin (CIPRO) 500 MG tablet; Take 1 tablet (500 mg total) by mouth 2 (two) times daily.  Dispense: 20 tablet; Refill: 0 - metroNIDAZOLE (FLAGYL) 500 MG tablet; Take 1 tablet (500 mg total) by mouth 2 (two) times daily.  Dispense: 20 tablet; Refill: 0  2. Vaginal yeast infection Diflucan 150mg  may be taken once if yeast infection developsm May repeat dose in three days for persistent symptoms.  - fluconazole (DIFLUCAN) 150 MG tablet; Take 1 tablet po once. May repeat dose in 3 days as needed for persistent symptoms.  Dispense: 3 tablet; Refill: 0  3. Mild persistent asthma, uncomplicated Continue advair twice daily. Use rescue inhalers/neb treatments as needed and as prescribed for acute asthma symptoms.  - Fluticasone-Salmeterol (ADVAIR) 250-50 MCG/DOSE AEPB; Inhale 1 puff into the lungs 2 (two) times daily.  Dispense: 180 each; Refill: 1  General Counseling: haniah penny understanding of the findings of todays visit and agrees with plan of treatment. I have discussed any further  diagnostic evaluation that may be needed or ordered today. We also reviewed Victoria Castaneda medications today. Victoria Castaneda has been encouraged to call the office with any questions or concerns that should arise related to todays visit.     Counseling:   This patient was seen by Vira Agar FNP Collaboration with Dr Vincent Gros as a part of collaborative care agreement  Meds ordered this encounter  Medications  . ciprofloxacin (CIPRO) 500 MG tablet    Sig: Take 1 tablet (500 mg total) by mouth 2 (two) times daily.    Dispense:  20 tablet    Refill:  0    Order Specific Question:   Supervising Provider    Answer:   Lyndon Code [[id number]]  . metroNIDAZOLE (FLAGYL) 500 MG tablet    Sig: Take 1 tablet (500 mg total) by mouth 2 (two) times daily.    Dispense:  20 tablet    Refill:  0    Order Specific Question:   Supervising Provider    Answer:   Lyndon Code [[id number]]  . fluconazole (DIFLUCAN) 150 MG tablet    Sig: Take 1 tablet po once. May repeat dose in 3 days as needed for persistent symptoms.    Dispense:  3 tablet    Refill:  0    Order Specific Question:   Supervising Provider    Answer:   Lyndon Code [[id number]]  . Fluticasone-Salmeterol (ADVAIR) 250-50 MCG/DOSE AEPB    Sig: Inhale 1 puff into the lungs 2 (two) times daily.    Dispense:  180 each    Refill:  1    Order Specific Question:   Supervising Provider    Answer:   Lyndon Code [[id number]]    Time spent: 30 Minutes

## 2020-06-08 ENCOUNTER — Telehealth: Payer: Self-pay

## 2020-06-08 ENCOUNTER — Other Ambulatory Visit: Payer: Self-pay

## 2020-06-08 NOTE — Telephone Encounter (Signed)
done

## 2020-06-15 ENCOUNTER — Other Ambulatory Visit: Payer: Self-pay

## 2020-06-24 ENCOUNTER — Telehealth: Payer: Self-pay

## 2020-06-24 NOTE — Telephone Encounter (Signed)
Confirmed and screened for 06-28-20 ov. 

## 2020-06-25 ENCOUNTER — Ambulatory Visit
Admission: EM | Admit: 2020-06-25 | Discharge: 2020-06-25 | Disposition: A | Discharge status: Home / Self Care | Payer: Managed Care, Other (non HMO) | Attending: Physician Assistant | Admitting: Physician Assistant

## 2020-06-25 ENCOUNTER — Encounter: Payer: Self-pay | Admitting: Emergency Medicine

## 2020-06-25 ENCOUNTER — Other Ambulatory Visit: Payer: Self-pay

## 2020-06-25 DIAGNOSIS — R8290 Unspecified abnormal findings in urine: Secondary | ICD-10-CM | POA: Diagnosis not present

## 2020-06-25 DIAGNOSIS — R1030 Lower abdominal pain, unspecified: Secondary | ICD-10-CM

## 2020-06-25 DIAGNOSIS — K5732 Diverticulitis of large intestine without perforation or abscess without bleeding: Secondary | ICD-10-CM | POA: Diagnosis not present

## 2020-06-25 DIAGNOSIS — R197 Diarrhea, unspecified: Secondary | ICD-10-CM | POA: Insufficient documentation

## 2020-06-25 LAB — URINALYSIS, COMPLETE (UACMP) WITH MICROSCOPIC
Glucose, UA: NEGATIVE mg/dL
Hgb urine dipstick: NEGATIVE
Nitrite: NEGATIVE
Protein, ur: NEGATIVE mg/dL
Specific Gravity, Urine: 1.025 (ref 1.005–1.030)
pH: 5 (ref 5.0–8.0)

## 2020-06-25 MED ORDER — AMOXICILLIN-POT CLAVULANATE 875-125 MG PO TABS: 1.0000 | ORAL_TABLET | Freq: Two times a day (BID) | ORAL | 0 refills | Status: AC

## 2020-06-25 NOTE — Discharge Instructions (Addendum)
If your symptoms do not improve with the prescribed medication, you should consider following back up with our clinic or PCP to have your stool tested to ensure you do not have any infectious causes of diarrhea which can cause similar symptoms.  ABDOMINAL PAIN: You may take Tylenol for pain relief. Use medications as directed including antiemetics and antidiarrheal medications if suggested or prescribed. You should increase fluids and electrolytes as well as rest over these next several days. If you have any questions or concerns, or if your symptoms are not improving or if especially if they acutely worsen, please call or stop back to the clinic immediately and we will be happy to help you or go to the ER   ABDOMINAL PAIN RED FLAGS: Seek immediate further care if: symptoms remain the same or worsen over the next 3-7 days, you are unable to keep fluids down, you see blood or mucus in your stool, you vomit black or dark red material, you have a fever of 101.F or higher, you have localized and/or persistent abdominal pain

## 2020-06-25 NOTE — ED Provider Notes (Signed)
[facility]    CSN: [id number] Arrival date & time: 06/25/20  1028      History   Chief Complaint Chief Complaint  Patient presents with  . Abdominal Pain    HPI Victoria Castaneda is a 54 y.o. female.   Patient presents for abdominal pain x3 days.  She also admits to loose stools with mucus present.  Patient says the abdominal pain is about 3-4 out of 10.  She says the pain is cramping pain all the way across the lower abdomen.  She has a history of diverticulitis and says that her pain is consistent with a diverticulitis flareup.  She states she had a diverticulitis flareup about 3 weeks ago and was treated with Cipro and Flagyl which cleared up her symptoms until a few days ago.  Patient denies fever, fatigue, nausea, vomiting, blood in stool, dark stools, dysuria, vaginal discharge, abnormal vaginal bleeding.  She says she is confident her symptoms are consistent with diverticulitis flareup.  She had a colonoscopy 4 years ago where she was seen to have many diverticula and has a couple episodes of diverticulitis yearly.  There are no other concerns today.  HPI  Past Medical History:  Diagnosis Date  . Arthritis   . Asthma   . Diverticulitis 06/2017  . Headache   . Hypersomnia, unspecified   . Mild persistent asthma   . Mixed hyperlipidemia   . Motion sickness   . Primary ovarian failure   . Uncomplicated asthma   . Vitamin D deficiency, unspecified   . Wheezing     Patient Active Problem List   Diagnosis Date Noted  . Acute recurrent pansinusitis 03/22/2020  . Encounter for screening mammogram for malignant neoplasm of breast 03/22/2020  . Vaginal yeast infection 09/21/2019  . Diverticulitis of large intestine without perforation or abscess without bleeding 08/17/2019  . Generalized abdominal pain 08/17/2019  . Primary osteoarthritis of right shoulder 03/22/2019  . Mild intermittent asthma without complication 03/22/2019  . Acute upper respiratory infection  09/19/2018  . Wheezing 09/19/2018  . Cough 09/19/2018  . Bilateral impacted cerumen 06/10/2018  . Morbid obesity (HCC) 06/10/2018  . Candidiasis 02/13/2018  . Dysuria 02/13/2018  . Pain in left knee 12/26/2017  . Body mass index 50.0-59.9, adult (HCC) 10/17/2017  . Hypersomnia, unspecified 10/17/2017  . Otalgia, bilateral 10/17/2017  . Right leg pain 10/17/2017  . Essential (primary) hypertension 10/17/2017  . Shortness of breath 10/17/2017  . Mild persistent asthma with exacerbation 10/17/2017  . Mixed hyperlipidemia 10/17/2017  . Melena 10/17/2017  . Other specified menopausal and perimenopausal disorders 10/17/2017  . Low back pain 10/17/2017  . Vitamin D deficiency, unspecified 10/17/2017  . Urinary tract infection without hematuria 10/17/2017  . Gastroesophageal reflux disease 10/17/2017  . Mild persistent asthma, uncomplicated 10/17/2017  . Other primary ovarian failure 10/17/2017  . Encounter for general adult medical examination with abnormal findings     Past Surgical History:  Procedure Laterality Date  . ABDOMINAL HYSTERECTOMY    . CHOLECYSTECTOMY    . COLONOSCOPY WITH PROPOFOL N/A 12/10/2015   Procedure: COLONOSCOPY WITH PROPOFOL;  Surgeon: Midge Minium, MD;  Location: [facility];  Service: Endoscopy;  Laterality: N/A;    OB History   No obstetric history on file.      Home Medications    Prior to Admission medications   Medication Sig Start Date End Date Taking? Authorizing Provider  Fluticasone-Salmeterol (ADVAIR) 250-50 MCG/DOSE AEPB Inhale 1 puff into the lungs 2 (two)  times daily. 05/31/20  Yes Boscia, Kathlynn Grate, NP  furosemide (LASIX) 20 MG tablet Take 1 tablet (20 mg total) by mouth daily. 04/19/20  Yes Lyndon Code, MD  PROAIR HFA 108 [phone] Base) MCG/ACT inhaler Inhale 2 puffs into the lungs every 6 (six) hours as needed for wheezing or shortness of breath. 08/01/19  Yes Boscia, Heather E, NP  albuterol (PROVENTIL) (2.5 MG/3ML) 0.083% nebulizer  solution Take 2.5 mg by nebulization every 6 (six) hours as needed for wheezing or shortness of breath.    [provider]  amoxicillin-clavulanate (AUGMENTIN) 875-125 MG tablet Take 1 tablet by mouth every 12 (twelve) hours for 7 days. 06/25/20 07/02/20  Shirlee Latch, PA-C  ergocalciferol (DRISDOL) 1.25 MG (50000 UT) capsule Take 1 capsule (50,000 Units total) by mouth once a week. Patient not taking: Reported on 05/31/2020 10/14/19   Carlean Jews, NP  fluconazole (DIFLUCAN) 150 MG tablet Take 1 tablet po once. May repeat dose in 3 days as needed for persistent symptoms. 05/31/20   Carlean Jews, NP  fluticasone (FLONASE) 50 MCG/ACT nasal spray Place into both nostrils daily.    [provider]  HYDROcodone-acetaminophen (NORCO/VICODIN) 5-325 MG tablet Take 1 tablet by mouth 3 (three) times daily as needed for moderate pain. Patient not taking: Reported on 05/31/2020 08/01/19   Carlean Jews, NP  ibuprofen (ADVIL) 800 MG tablet Take 1 tablet (800 mg total) by mouth every 8 (eight) hours as needed. 03/21/19   Carlean Jews, NP  ipratropium-albuterol (DUONEB) 0.5-2.5 (3) MG/3ML SOLN Take 3 mLs by nebulization every 6 (six) hours as needed. 10/30/17   Carlean Jews, NP  phenazopyridine (PYRIDIUM) 200 MG tablet Take 1 tablet (200 mg total) by mouth 3 (three) times daily as needed for pain. Patient not taking: Reported on 05/31/2020 03/22/20   Carlean Jews, NP  sulfamethoxazole-trimethoprim (BACTRIM DS) 800-160 MG tablet Take 1 tablet by mouth 2 (two) times daily. Patient not taking: Reported on 05/31/2020 03/22/20   Carlean Jews, NP    Family History Family History  Problem Relation Age of Onset  . Diabetes Mother   . Breast cancer Neg Hx     Social History Social History   Tobacco Use  . Smoking status: Never Smoker  . Smokeless tobacco: Never Used  Substance Use Topics  . Alcohol use: Yes    Comment: occasionally  . Drug use: No     Allergies    Patient has no known allergies.   Review of Systems Review of Systems  Constitutional: Negative for chills, diaphoresis, fatigue and fever.  HENT: Negative for congestion, ear pain, rhinorrhea and sore throat.   Respiratory: Negative for cough and shortness of breath.   Cardiovascular: Negative for chest pain.  Gastrointestinal: Positive for abdominal pain and diarrhea. Negative for nausea and vomiting.  Genitourinary: Negative for dysuria and vaginal discharge.  Musculoskeletal: Negative for arthralgias and myalgias.  Skin: Negative for rash.  Neurological: Negative for weakness and headaches.  Hematological: Negative for adenopathy.     Physical Exam Triage Vital Signs ED Triage Vitals  Enc Vitals Group     BP 06/25/20 1116 140/82     Pulse Rate 06/25/20 1116 90     Resp 06/25/20 1116 16     Temp 06/25/20 1116 98.1 F (36.7 C)     Temp Source 06/25/20 1116 Oral     SpO2 06/25/20 1116 100 %     Weight 06/25/20 1113 (!) 306 lb (138.8  kg)     Height 06/25/20 1113 5\' 4"  (1.626 m)     Head Circumference --      Peak Flow --      Pain Score 06/25/20 1113 4     Pain Loc --      Pain Edu? --      Excl. in GC? --    No data found.  Updated Vital Signs BP 140/82 (BP Location: Right Arm)   Pulse 90   Temp 98.1 F (36.7 C) (Oral)   Resp 16   Ht 5\' 4"  (1.626 m)   Wt (!) 306 lb (138.8 kg)   SpO2 100%   BMI 52.52 kg/m      Physical Exam Vitals and nursing note reviewed.  Constitutional:      General: She is not in acute distress.    Appearance: Normal appearance. She is not ill-appearing or toxic-appearing.  HENT:     Head: Normocephalic and atraumatic.     Mouth/Throat:     Mouth: Mucous membranes are moist.     Pharynx: Oropharynx is clear.  Eyes:     General: No scleral icterus.       Right eye: No discharge.        Left eye: No discharge.     Conjunctiva/sclera: Conjunctivae normal.  Cardiovascular:     Rate and Rhythm: Normal rate and regular rhythm.      Heart sounds: Normal heart sounds.  Pulmonary:     Effort: Pulmonary effort is normal. No respiratory distress.     Breath sounds: Normal breath sounds.  Abdominal:     General: Bowel sounds are normal.     Palpations: Abdomen is soft.     Tenderness: There is abdominal tenderness (mild/moderate TTP LLQ, suprapubic and RLQ, most tenderness LLQ). There is no right CVA tenderness, left CVA tenderness or guarding.  Musculoskeletal:     Cervical back: Neck supple.  Skin:    General: Skin is dry.  Neurological:     General: No focal deficit present.     Mental Status: She is alert. Mental status is at baseline.     Motor: No weakness.     Gait: Gait normal.  Psychiatric:        Mood and Affect: Mood normal.        Behavior: Behavior normal.        Thought Content: Thought content normal.      UC Treatments / Results  Labs (all labs ordered are listed, but only abnormal results are displayed) Labs Reviewed  URINALYSIS, COMPLETE (UACMP) WITH MICROSCOPIC - Abnormal; Notable for the following components:      Result Value   APPearance CLOUDY (*)    Bilirubin Urine SMALL (*)    Ketones, ur TRACE (*)    Leukocytes,Ua TRACE (*)    Bacteria, UA FEW (*)    All other components within normal limits  URINE CULTURE    EKG   Radiology No results found.  Procedures Procedures (including critical care time)  Medications Ordered in UC Medications - No data to display  Initial Impression / Assessment and Plan / UC Course  I have reviewed the triage vital signs and the nursing notes.  Pertinent labs & imaging results that were available during my care of the patient were reviewed by me and considered in my medical decision making (see chart for details).   54 year old female presenting for lower abdominal cramping for 3 days that is mild to moderate.  She believes symptoms are consistent with diverticulitis flareup.  She declines any labs or further work-up today.  I did do a  urinalysis which shows trace leuks so I have sent for culture.  Treating at this time with Augmentin for possible UTI and diverticulitis that is mild.  Also advised to increase rest and fluids.  Advised patient if symptoms do not improve she should follow back up with our clinic or PCP to have stool studies performed to ensure she does not have any infectious diarrhea.  Patient understanding and agreeable.   Final Clinical Impressions(s) / UC Diagnoses   Final diagnoses:  Diverticulitis of colon  Lower abdominal pain  Diarrhea, unspecified type  Abnormal urinalysis     Discharge Instructions     If your symptoms do not improve with the prescribed medication, you should consider following back up with our clinic or PCP to have your stool tested to ensure you do not have any infectious causes of diarrhea which can cause similar symptoms.  ABDOMINAL PAIN: You may take Tylenol for pain relief. Use medications as directed including antiemetics and antidiarrheal medications if suggested or prescribed. You should increase fluids and electrolytes as well as rest over these next several days. If you have any questions or concerns, or if your symptoms are not improving or if especially if they acutely worsen, please call or stop back to the clinic immediately and we will be happy to help you or go to the ER   ABDOMINAL PAIN RED FLAGS: Seek immediate further care if: symptoms remain the same or worsen over the next 3-7 days, you are unable to keep fluids down, you see blood or mucus in your stool, you vomit black or dark red material, you have a fever of 101.F or higher, you have localized and/or persistent abdominal pain      ED Prescriptions    Medication Sig Dispense Auth. Provider   amoxicillin-clavulanate (AUGMENTIN) 875-125 MG tablet Take 1 tablet by mouth every 12 (twelve) hours for 7 days. 14 tablet Gareth MorganEaves, Saudia Smyser B, PA-C     PDMP not reviewed this encounter.   Shirlee Latchaves, Kess Mcilwain B,  PA-C 06/25/20 1223

## 2020-06-25 NOTE — ED Triage Notes (Signed)
Patient c/o mid abdominal pain that started Tuesday night.  Patient reports that her bowel movements have been loose with mucus.  Patient denies fevers.  Patient denies vomiting.

## 2020-06-26 LAB — URINE CULTURE: Culture: NO GROWTH

## 2020-06-28 ENCOUNTER — Ambulatory Visit: Payer: Managed Care, Other (non HMO) | Admitting: Internal Medicine

## 2020-07-21 ENCOUNTER — Other Ambulatory Visit: Payer: Self-pay

## 2020-08-27 ENCOUNTER — Encounter: Payer: Self-pay | Admitting: Nurse Practitioner

## 2020-08-27 ENCOUNTER — Other Ambulatory Visit: Payer: Self-pay

## 2020-08-27 ENCOUNTER — Ambulatory Visit: Payer: Managed Care, Other (non HMO) | Admitting: Nurse Practitioner

## 2020-08-27 VITALS — BP 132/80 | HR 93 | Temp 98.0°F | Resp 16 | Ht 64.0 in | Wt 310.0 lb

## 2020-08-27 DIAGNOSIS — N390 Urinary tract infection, site not specified: Secondary | ICD-10-CM

## 2020-08-27 DIAGNOSIS — R300 Dysuria: Secondary | ICD-10-CM

## 2020-08-27 DIAGNOSIS — B373 Candidiasis of vulva and vagina: Secondary | ICD-10-CM | POA: Diagnosis not present

## 2020-08-27 DIAGNOSIS — B3731 Acute candidiasis of vulva and vagina: Secondary | ICD-10-CM

## 2020-08-27 LAB — POCT URINALYSIS DIPSTICK
Bilirubin, UA: NEGATIVE
Blood, UA: NEGATIVE
Glucose, UA: NEGATIVE
Ketones, UA: NEGATIVE
Nitrite, UA: NEGATIVE
Protein, UA: NEGATIVE
Spec Grav, UA: 1.015 (ref 1.010–1.025)
Urobilinogen, UA: 0.2 E.U./dL
pH, UA: 5 (ref 5.0–8.0)

## 2020-08-27 MED ORDER — SULFAMETHOXAZOLE-TRIMETHOPRIM 800-160 MG PO TABS: 1.0000 | ORAL_TABLET | Freq: Two times a day (BID) | ORAL | 0 refills | Status: DC

## 2020-08-27 MED ORDER — FLUCONAZOLE 150 MG PO TABS: ORAL_TABLET | ORAL | 0 refills | Status: DC

## 2020-08-27 MED ORDER — PHENAZOPYRIDINE HCL 200 MG PO TABS: 200.0000 mg | ORAL_TABLET | Freq: Three times a day (TID) | ORAL | 0 refills | Status: DC | PRN

## 2020-08-27 NOTE — Progress Notes (Signed)
[facility] [address] [place], [place] [address]  Internal MEDICINE  Office Visit Note  Patient Name: Victoria Castaneda  [id number]  [id number]  Date of Service: 08/27/2020   Pt is here for a sick visit.  Chief Complaint  Patient presents with  . Acute Visit  . Urinary Tract Infection     The patient presents for acute visit. Has had flank pain, bladder pressure, and some nausea for the past few days. Denies fever. She states that symptoms have gradually become worse, even though she has been drinking cranberry juice and water.        Current Medication:  Outpatient Encounter Medications as of 08/27/2020  Medication Sig  . albuterol (PROVENTIL) (2.5 MG/3ML) 0.083% nebulizer solution Take 2.5 mg by nebulization every 6 (six) hours as needed for wheezing or shortness of breath.  . ergocalciferol (DRISDOL) 1.25 MG (50000 UT) capsule Take 1 capsule (50,000 Units total) by mouth once a week.  . fluconazole (DIFLUCAN) 150 MG tablet Take 1 tablet po once. May repeat dose in 3 days as needed for persistent symptoms.  . fluticasone (FLONASE) 50 MCG/ACT nasal spray Place into both nostrils daily.  . Fluticasone-Salmeterol (ADVAIR) 250-50 MCG/DOSE AEPB Inhale 1 puff into the lungs 2 (two) times daily.  . furosemide (LASIX) 20 MG tablet Take 1 tablet (20 mg total) by mouth daily.  Marland Kitchen HYDROcodone-acetaminophen (NORCO/VICODIN) 5-325 MG tablet Take 1 tablet by mouth 3 (three) times daily as needed for moderate pain.  Marland Kitchen ibuprofen (ADVIL) 800 MG tablet Take 1 tablet (800 mg total) by mouth every 8 (eight) hours as needed.  Marland Kitchen ipratropium-albuterol (DUONEB) 0.5-2.5 (3) MG/3ML SOLN Take 3 mLs by nebulization every 6 (six) hours as needed.  . phenazopyridine (PYRIDIUM) 200 MG tablet Take 1 tablet (200 mg total) by mouth 3 (three) times daily as needed for pain.  Marland Kitchen PROAIR HFA 108 (90 Base) MCG/ACT inhaler Inhale 2 puffs into the lungs every 6 (six) hours as needed for wheezing or  shortness of breath.  . sulfamethoxazole-trimethoprim (BACTRIM DS) 800-160 MG tablet Take 1 tablet by mouth 2 (two) times daily.  . [DISCONTINUED] fluconazole (DIFLUCAN) 150 MG tablet Take 1 tablet po once. May repeat dose in 3 days as needed for persistent symptoms.  . [DISCONTINUED] phenazopyridine (PYRIDIUM) 200 MG tablet Take 1 tablet (200 mg total) by mouth 3 (three) times daily as needed for pain.  . [DISCONTINUED] sulfamethoxazole-trimethoprim (BACTRIM DS) 800-160 MG tablet Take 1 tablet by mouth 2 (two) times daily.   No facility-administered encounter medications on file as of 08/27/2020.      Medical History: Past Medical History:  Diagnosis Date  . Arthritis   . Asthma   . Diverticulitis 06/2017  . Headache   . Hypersomnia, unspecified   . Mild persistent asthma   . Mixed hyperlipidemia   . Motion sickness   . Primary ovarian failure   . Uncomplicated asthma   . Vitamin D deficiency, unspecified   . Wheezing      Today's Vitals   08/27/20 0939  BP: 132/80  Pulse: 93  Resp: 16  Temp: 98 F (36.7 C)  SpO2: 98%  Weight: (!) 310 lb (140.6 kg)  Height: 5\' 4"  (1.626 m)   Body mass index is 53.21 kg/m.  Review of Systems  Constitutional: Negative for activity change, chills, fatigue and unexpected weight change.  HENT: Negative for congestion, postnasal drip, rhinorrhea, sneezing and sore throat.   Respiratory: Negative for cough, chest tightness, shortness of breath  and wheezing.   Cardiovascular: Negative for chest pain and palpitations.  Gastrointestinal: Positive for nausea. Negative for abdominal pain, constipation, diarrhea and vomiting.  Genitourinary: Positive for dysuria, flank pain and urgency. Negative for frequency.  Musculoskeletal: Positive for back pain. Negative for arthralgias, joint swelling and neck pain.  Skin: Negative for rash.  Neurological: Negative.  Negative for tremors and numbness.  Hematological: Negative for adenopathy. Does not  bruise/bleed easily.  Psychiatric/Behavioral: Negative for behavioral problems (Depression), sleep disturbance and suicidal ideas. The patient is not nervous/anxious.     Physical Exam Vitals and nursing note reviewed.  Constitutional:      General: She is not in acute distress.    Appearance: Normal appearance. She is well-developed. She is obese. She is not diaphoretic.  HENT:     Head: Normocephalic and atraumatic.     Mouth/Throat:     Pharynx: No oropharyngeal exudate.  Eyes:     Pupils: Pupils are equal, round, and reactive to light.  Neck:     Thyroid: No thyromegaly.     Vascular: No JVD.     Trachea: No tracheal deviation.  Cardiovascular:     Rate and Rhythm: Normal rate and regular rhythm.     Heart sounds: Normal heart sounds. No murmur heard.  No friction rub. No gallop.   Pulmonary:     Effort: Pulmonary effort is normal. No respiratory distress.     Breath sounds: Normal breath sounds. No wheezing or rales.  Chest:     Chest wall: No tenderness.  Abdominal:     General: Bowel sounds are normal.     Palpations: Abdomen is soft.  Genitourinary:    Comments: Urine sample positive for trace WBC only.  Musculoskeletal:        General: Normal range of motion.     Cervical back: Normal range of motion and neck supple.  Lymphadenopathy:     Cervical: No cervical adenopathy.  Skin:    General: Skin is warm and dry.  Neurological:     Mental Status: She is alert and oriented to person, place, and time.     Cranial Nerves: No cranial nerve deficit.  Psychiatric:        Mood and Affect: Mood normal.        Behavior: Behavior normal.        Thought Content: Thought content normal.        Judgment: Judgment normal.    Assessment/Plan:  1. Urinary tract infection without hematuria, site unspecified Start bactrim DS twice daily for 10 days. Send urine for culture and sensitivity and adjust antibiotics as indicated.  - sulfamethoxazole-trimethoprim (BACTRIM DS)  800-160 MG tablet; Take 1 tablet by mouth 2 (two) times daily.  Dispense: 20 tablet; Refill: 0  2. Dysuria Pyridium 200mg  may be taken up to three times daily as needed for bladder pain/spasms.  - POCT Urinalysis Dipstick - CULTURE, URINE COMPREHENSIVE - phenazopyridine (PYRIDIUM) 200 MG tablet; Take 1 tablet (200 mg total) by mouth 3 (three) times daily as needed for pain.  Dispense: 10 tablet; Refill: 0  3. Vaginal yeast infection Use diflucan as needed if yeast infection develops. Repeat dose in three days for persistent symptoms.  - fluconazole (DIFLUCAN) 150 MG tablet; Take 1 tablet po once. May repeat dose in 3 days as needed for persistent symptoms.  Dispense: 3 tablet; Refill: 0   General Counseling: morelia cassells understanding of the findings of todays visit and agrees with plan of treatment. I have  discussed any further diagnostic evaluation that may be needed or ordered today. We also reviewed her medications today. she has been encouraged to call the office with any questions or concerns that should arise related to todays visit.    Counseling:  This patient was seen by Vincent Gros FNP Collaboration with Dr Lyndon Code as a part of collaborative care agreement  Orders Placed This Encounter  Procedures  . CULTURE, URINE COMPREHENSIVE  . POCT Urinalysis Dipstick    Meds ordered this encounter  Medications  . fluconazole (DIFLUCAN) 150 MG tablet    Sig: Take 1 tablet po once. May repeat dose in 3 days as needed for persistent symptoms.    Dispense:  3 tablet    Refill:  0    Order Specific Question:   Supervising Provider    Answer:   Lyndon Code [[id number]]  . phenazopyridine (PYRIDIUM) 200 MG tablet    Sig: Take 1 tablet (200 mg total) by mouth 3 (three) times daily as needed for pain.    Dispense:  10 tablet    Refill:  0    Order Specific Question:   Supervising Provider    Answer:   Lyndon Code [[id number]]  . sulfamethoxazole-trimethoprim (BACTRIM DS) 800-160  MG tablet    Sig: Take 1 tablet by mouth 2 (two) times daily.    Dispense:  20 tablet    Refill:  0    Order Specific Question:   Supervising Provider    Answer:   Lyndon Code [[id number]]    Time spent: 25 Minutes

## 2020-08-31 ENCOUNTER — Other Ambulatory Visit: Payer: Self-pay

## 2020-08-31 DIAGNOSIS — R062 Wheezing: Secondary | ICD-10-CM

## 2020-08-31 MED ORDER — PROAIR HFA 108 (90 BASE) MCG/ACT IN AERS: 2.0000 | INHALATION_SPRAY | Freq: Four times a day (QID) | RESPIRATORY_TRACT | 5 refills | Status: DC | PRN

## 2020-09-01 LAB — CULTURE, URINE COMPREHENSIVE

## 2020-09-04 NOTE — Progress Notes (Signed)
Patient started on bactrim at time of visit.

## 2020-09-20 ENCOUNTER — Other Ambulatory Visit: Payer: Self-pay

## 2020-09-21 ENCOUNTER — Ambulatory Visit: Payer: Managed Care, Other (non HMO) | Admitting: Nurse Practitioner

## 2020-09-30 ENCOUNTER — Other Ambulatory Visit: Payer: Self-pay

## 2020-10-05 ENCOUNTER — Other Ambulatory Visit: Payer: Self-pay

## 2020-10-05 MED ORDER — AZITHROMYCIN 250 MG PO TABS: ORAL_TABLET | ORAL | 0 refills | Status: DC

## 2020-10-05 NOTE — Telephone Encounter (Signed)
Pt called and informed us that she tested positive for Covid and she is having severe HA, nausea, cough for 2 days now.  Per taylor send z-pak take one tablet by mouth daily for 10 days.  I sent rx to pharmacy

## 2020-10-19 ENCOUNTER — Other Ambulatory Visit: Payer: Self-pay

## 2020-10-19 ENCOUNTER — Ambulatory Visit
Admission: RE | Admit: 2020-10-19 | Discharge: 2020-10-19 | Disposition: A | Discharge status: Home / Self Care | Payer: 59 | Attending: Internal Medicine | Admitting: Internal Medicine

## 2020-10-19 ENCOUNTER — Ambulatory Visit (INDEPENDENT_AMBULATORY_CARE_PROVIDER_SITE_OTHER): Payer: 59 | Admitting: Internal Medicine

## 2020-10-19 ENCOUNTER — Ambulatory Visit
Admission: RE | Admit: 2020-10-19 | Discharge: 2020-10-19 | Disposition: A | Discharge status: Home / Self Care | Payer: 59 | Source: Ambulatory Visit | Attending: Internal Medicine | Admitting: Internal Medicine

## 2020-10-19 ENCOUNTER — Encounter: Payer: Self-pay | Admitting: Internal Medicine

## 2020-10-19 VITALS — BP 165/80 | HR 104 | Temp 97.4°F | Resp 16 | Ht 63.0 in | Wt 310.2 lb

## 2020-10-19 DIAGNOSIS — R059 Cough, unspecified: Secondary | ICD-10-CM

## 2020-10-19 DIAGNOSIS — U071 COVID-19: Secondary | ICD-10-CM

## 2020-10-19 DIAGNOSIS — R030 Elevated blood-pressure reading, without diagnosis of hypertension: Secondary | ICD-10-CM

## 2020-10-19 NOTE — Progress Notes (Signed)
[facility] [address] [place], [place] [address]  Internal MEDICINE  Office Visit Note  Patient Name: Victoria Castaneda  [id number]  [id number]  Date of Service: 10/26/2020  Chief Complaint  Patient presents with  . Follow-up  . Hyperlipidemia  . Asthma    HPI Pt is here for follow up post COVID. Pt had COVID 12/28, she was treated with Azithromycin, improving but still coughing and feels weak, BP is elevated, denies any fever or chills    Current Medication: Outpatient Encounter Medications as of 10/19/2020  Medication Sig  . azithromycin (ZITHROMAX) 250 MG tablet Take one tab a day for 10 days for uri  . albuterol (PROVENTIL) (2.5 MG/3ML) 0.083% nebulizer solution Take 2.5 mg by nebulization every 6 (six) hours as needed for wheezing or shortness of breath.  . ergocalciferol (DRISDOL) 1.25 MG (50000 UT) capsule Take 1 capsule (50,000 Units total) by mouth once a week.  . fluconazole (DIFLUCAN) 150 MG tablet Take 1 tablet po once. May repeat dose in 3 days as needed for persistent symptoms.  . fluticasone (FLONASE) 50 MCG/ACT nasal spray Place into both nostrils daily.  . Fluticasone-Salmeterol (ADVAIR) 250-50 MCG/DOSE AEPB Inhale 1 puff into the lungs 2 (two) times daily.  . furosemide (LASIX) 20 MG tablet Take 1 tablet (20 mg total) by mouth daily.  Marland Kitchen HYDROcodone-acetaminophen (NORCO/VICODIN) 5-325 MG tablet Take 1 tablet by mouth 3 (three) times daily as needed for moderate pain.  Marland Kitchen ibuprofen (ADVIL) 800 MG tablet Take 1 tablet (800 mg total) by mouth every 8 (eight) hours as needed.  Marland Kitchen ipratropium-albuterol (DUONEB) 0.5-2.5 (3) MG/3ML SOLN Take 3 mLs by nebulization every 6 (six) hours as needed.  . phenazopyridine (PYRIDIUM) 200 MG tablet Take 1 tablet (200 mg total) by mouth 3 (three) times daily as needed for pain.  Marland Kitchen PROAIR HFA 108 (90 Base) MCG/ACT inhaler Inhale 2 puffs into the lungs every 6 (six) hours as needed for wheezing or shortness of breath.  .  sulfamethoxazole-trimethoprim (BACTRIM DS) 800-160 MG tablet Take 1 tablet by mouth 2 (two) times daily.  . [DISCONTINUED] azithromycin (ZITHROMAX) 250 MG tablet Take 1 tablet by mouth daily for 10 days   No facility-administered encounter medications on file as of 10/19/2020.    Surgical History: Past Surgical History:  Procedure Laterality Date  . ABDOMINAL HYSTERECTOMY    . CHOLECYSTECTOMY    . COLONOSCOPY WITH PROPOFOL N/A 12/10/2015   Procedure: COLONOSCOPY WITH PROPOFOL;  Surgeon: Midge Minium, MD;  Location: [facility];  Service: Endoscopy;  Laterality: N/A;    Medical History: Past Medical History:  Diagnosis Date  . Arthritis   . Asthma   . Diverticulitis 06/2017  . Headache   . Hypersomnia, unspecified   . Mild persistent asthma   . Mixed hyperlipidemia   . Motion sickness   . Primary ovarian failure   . Uncomplicated asthma   . Vitamin D deficiency, unspecified   . Wheezing     Family History: Family History  Problem Relation Age of Onset  . Diabetes Mother   . Breast cancer Neg Hx     Social History   Socioeconomic History  . Marital status: Single    Spouse name: Not on file  . Number of children: Not on file  . Years of education: Not on file  . Highest education level: Not on file  Occupational History  . Not on file  Tobacco Use  . Smoking status: Never Smoker  . Smokeless tobacco:  Never Used  Substance and Sexual Activity  . Alcohol use: Yes    Comment: occasionally  . Drug use: No  . Sexual activity: Not on file  Other Topics Concern  . Not on file  Social History Narrative  . Not on file   Social Determinants of Health   Financial Resource Strain: Not on file  Food Insecurity: Not on file  Transportation Needs: Not on file  Physical Activity: Not on file  Stress: Not on file  Social Connections: Not on file  Intimate Partner Violence: Not on file      Review of Systems  Constitutional: Positive for fatigue. Negative  for fever.  HENT: Negative for congestion, mouth sores and postnasal drip.   Respiratory: Positive for cough and shortness of breath.   Cardiovascular: Negative for chest pain.       Elevated blood pressure   Genitourinary: Negative for flank pain.  Psychiatric/Behavioral: Negative.     Vital Signs: BP (!) 165/80 Comment: 181/89  Pulse (!) 104   Temp (!) 97.4 F (36.3 C)   Resp 16   Ht 5\' 3"  (1.6 m)   Wt (!) 310 lb 3.2 oz (140.7 kg)   SpO2 98%   BMI 54.95 kg/m    Physical Exam Constitutional:      General: She is not in acute distress.    Appearance: She is well-developed and well-nourished. She is not diaphoretic.  HENT:     Head: Normocephalic and atraumatic.     Mouth/Throat:     Mouth: Oropharynx is clear and moist.     Pharynx: No oropharyngeal exudate.  Eyes:     Extraocular Movements: EOM normal.     Pupils: Pupils are equal, round, and reactive to light.  Neck:     Thyroid: No thyromegaly.     Vascular: No JVD.     Trachea: No tracheal deviation.  Cardiovascular:     Rate and Rhythm: Normal rate and regular rhythm.     Heart sounds: Normal heart sounds. No murmur heard. No friction rub. No gallop.   Pulmonary:     Effort: Pulmonary effort is normal. No respiratory distress.     Breath sounds: No wheezing or rales.  Chest:     Chest wall: No tenderness.  Abdominal:     General: Bowel sounds are normal.     Palpations: Abdomen is soft.  Musculoskeletal:        General: Normal range of motion.     Cervical back: Normal range of motion and neck supple.  Lymphadenopathy:     Cervical: No cervical adenopathy.  Skin:    General: Skin is warm and dry.  Neurological:     Mental Status: She is alert and oriented to person, place, and time.     Cranial Nerves: No cranial nerve deficit.  Psychiatric:        Mood and Affect: Mood and affect normal.        Behavior: Behavior normal.        Thought Content: Thought content normal.        Judgment: Judgment  normal.     Assessment/Plan:. 1. COVID-19 virus infection Slow recovery, will get cxr, might repeat therapy  2. Cough Albuterol MDI 2 pufss tid prn - DG Chest 2 View; Future  3. Elevated blood pressure reading No previous H/O hypertension, need to monitor at home  Inial blood pressure 181/89 did come down but still elevated    General Counseling: ericha whittingham understanding  of the findings of todays visit and agrees with plan of treatment. I have discussed any further diagnostic evaluation that may be needed or ordered today. We also reviewed her medications today. she has been encouraged to call the office with any questions or concerns that should arise related to todays visit.    Orders Placed This Encounter  Procedures  . DG Chest 2 View    Meds ordered this encounter  Medications  . azithromycin (ZITHROMAX) 250 MG tablet    Sig: Take one tab a day for 10 days for uri    Dispense:  10 tablet    Refill:  0    Total time spent 30 Minutes Time spent includes review of chart, medications, test results, and follow up plan with the patient.      Dr Lyndon Code Internal medicine

## 2020-10-20 ENCOUNTER — Telehealth: Payer: Self-pay

## 2020-10-20 NOTE — Progress Notes (Signed)
Cxr is normal, please inform the pt, can you find out how is she feeling

## 2020-10-20 NOTE — Telephone Encounter (Signed)
Spoke with pt, informed her cxr is normal, pt is feeling ok, still coughing up phlegm/green mucus

## 2020-10-20 NOTE — Telephone Encounter (Signed)
-----   Message from Lyndon Code, MD sent at 10/20/2020 11:32 AM EST ----- Cxr is normal, please inform the pt, can you find out how is she feeling

## 2020-10-21 MED ORDER — AZITHROMYCIN 250 MG PO TABS: ORAL_TABLET | ORAL | 0 refills | Status: DC

## 2020-10-26 ENCOUNTER — Telehealth: Payer: Self-pay

## 2020-10-26 NOTE — Telephone Encounter (Signed)
Per DFK I called and checked in on patient to see how she is doing.  Per patient she is feeling much better and the medication has seemed to helped a lot. I also asked patient how her blood pressure has been doing and patient informed me that she hasn't checked BP since office visit but she doesn't feel that it has been elevated.  Patient advised to call office if she needed anything

## 2020-10-31 ENCOUNTER — Other Ambulatory Visit: Payer: Self-pay

## 2020-10-31 MED ORDER — FUROSEMIDE 20 MG PO TABS: 20.0000 mg | ORAL_TABLET | Freq: Every day | ORAL | 1 refills | Status: DC

## 2020-11-15 ENCOUNTER — Other Ambulatory Visit: Payer: Self-pay

## 2020-11-15 DIAGNOSIS — J4530 Mild persistent asthma, uncomplicated: Secondary | ICD-10-CM

## 2020-11-15 MED ORDER — FLUTICASONE-SALMETEROL 250-50 MCG/DOSE IN AEPB: 1.0000 | INHALATION_SPRAY | Freq: Two times a day (BID) | RESPIRATORY_TRACT | 1 refills | Status: DC

## 2020-11-30 ENCOUNTER — Ambulatory Visit: Payer: 59 | Admitting: Internal Medicine

## 2020-12-09 ENCOUNTER — Ambulatory Visit: Payer: 59 | Admitting: Hospice and Palliative Medicine

## 2021-01-24 ENCOUNTER — Ambulatory Visit: Payer: 59 | Admitting: Physician Assistant

## 2021-01-24 ENCOUNTER — Encounter: Payer: Self-pay | Admitting: Physician Assistant

## 2021-01-24 ENCOUNTER — Other Ambulatory Visit: Payer: Self-pay

## 2021-01-24 DIAGNOSIS — N390 Urinary tract infection, site not specified: Secondary | ICD-10-CM

## 2021-01-24 DIAGNOSIS — R062 Wheezing: Secondary | ICD-10-CM

## 2021-01-24 DIAGNOSIS — J4530 Mild persistent asthma, uncomplicated: Secondary | ICD-10-CM | POA: Diagnosis not present

## 2021-01-24 MED ORDER — NITROFURANTOIN MONOHYD MACRO 100 MG PO CAPS: ORAL_CAPSULE | ORAL | 0 refills | Status: DC

## 2021-01-24 MED ORDER — PROAIR HFA 108 (90 BASE) MCG/ACT IN AERS: 2.0000 | INHALATION_SPRAY | Freq: Four times a day (QID) | RESPIRATORY_TRACT | 5 refills | Status: DC | PRN

## 2021-01-24 NOTE — Progress Notes (Signed)
[facility] [address] [place], [place] [address]  Internal MEDICINE  Office Visit Note  Patient Name: Victoria Castaneda  [id number]  [id number]  Date of Service: 01/24/2021  Chief Complaint  Patient presents with  . Urinary Tract Infection    Pain with urination, and abdominal pain for 2 weeks and has got worse  . Back Pain     HPI Pt is here for a sick visit. -Low back pain and dysuria for 2 weeks. Also lower abdominal pain as well. Urgency, but not high volume. Symptoms have been worsening. Hx of UTIs that present this way. Denies any hematuria, odor, color changes, or incontinence. -Also asking to confirm inhaler has refills when they are needed.  Current Medication:  Outpatient Encounter Medications as of 01/24/2021  Medication Sig  . albuterol (PROVENTIL) (2.5 MG/3ML) 0.083% nebulizer solution Take 2.5 mg by nebulization every 6 (six) hours as needed for wheezing or shortness of breath.  . ergocalciferol (DRISDOL) 1.25 MG (50000 UT) capsule Take 1 capsule (50,000 Units total) by mouth once a week.  . fluticasone (FLONASE) 50 MCG/ACT nasal spray Place into both nostrils daily.  . Fluticasone-Salmeterol (ADVAIR) 250-50 MCG/DOSE AEPB Inhale 1 puff into the lungs 2 (two) times daily.  . furosemide (LASIX) 20 MG tablet Take 1 tablet (20 mg total) by mouth daily.  Marland Kitchen HYDROcodone-acetaminophen (NORCO/VICODIN) 5-325 MG tablet Take 1 tablet by mouth 3 (three) times daily as needed for moderate pain.  Marland Kitchen ibuprofen (ADVIL) 800 MG tablet Take 1 tablet (800 mg total) by mouth every 8 (eight) hours as needed.  Marland Kitchen ipratropium-albuterol (DUONEB) 0.5-2.5 (3) MG/3ML SOLN Take 3 mLs by nebulization every 6 (six) hours as needed.  . [DISCONTINUED] azithromycin (ZITHROMAX) 250 MG tablet Take one tab a day for 10 days for uri  . [DISCONTINUED] nitrofurantoin, macrocrystal-monohydrate, (MACROBID) 100 MG capsule Take 1 cap twice per day for 10 days.  . [DISCONTINUED] PROAIR HFA 108 (90 Base)  MCG/ACT inhaler Inhale 2 puffs into the lungs every 6 (six) hours as needed for wheezing or shortness of breath.  . nitrofurantoin, macrocrystal-monohydrate, (MACROBID) 100 MG capsule Take 1 cap twice per day for 10 days.  Marland Kitchen PROAIR HFA 108 (90 Base) MCG/ACT inhaler Inhale 2 puffs into the lungs every 6 (six) hours as needed for wheezing or shortness of breath.  . [DISCONTINUED] fluconazole (DIFLUCAN) 150 MG tablet Take 1 tablet po once. May repeat dose in 3 days as needed for persistent symptoms. (Patient not taking: Reported on 01/24/2021)  . [DISCONTINUED] phenazopyridine (PYRIDIUM) 200 MG tablet Take 1 tablet (200 mg total) by mouth 3 (three) times daily as needed for pain. (Patient not taking: Reported on 01/24/2021)  . [DISCONTINUED] sulfamethoxazole-trimethoprim (BACTRIM DS) 800-160 MG tablet Take 1 tablet by mouth 2 (two) times daily. (Patient not taking: Reported on 01/24/2021)   No facility-administered encounter medications on file as of 01/24/2021.      Medical History: Past Medical History:  Diagnosis Date  . Arthritis   . Asthma   . Diverticulitis 06/2017  . Headache   . Hypersomnia, unspecified   . Mild persistent asthma   . Mixed hyperlipidemia   . Motion sickness   . Primary ovarian failure   . Uncomplicated asthma   . Vitamin D deficiency, unspecified   . Wheezing      Vital Signs: BP 126/84   Pulse 88   Temp 98.1 F (36.7 C)   Resp 16   Ht 5\' 2"  (1.575 m)   Wt )  312 lb 3.2 oz (141.6 kg)   SpO2 96%   BMI 57.10 kg/m    Review of Systems  Constitutional: Negative for fatigue and fever.  HENT: Negative for congestion, mouth sores and postnasal drip.   Respiratory: Negative for cough.   Cardiovascular: Negative for chest pain.  Gastrointestinal: Positive for abdominal pain.  Genitourinary: Positive for dysuria, frequency and urgency. Negative for flank pain.  Musculoskeletal: Positive for back pain.  Psychiatric/Behavioral: Negative.     Physical  Exam Vitals and nursing note reviewed.  Constitutional:      General: She is not in acute distress.    Appearance: She is well-developed. She is obese. She is not diaphoretic.  HENT:     Head: Normocephalic and atraumatic.     Mouth/Throat:     Pharynx: No oropharyngeal exudate.  Eyes:     Pupils: Pupils are equal, round, and reactive to light.  Neck:     Thyroid: No thyromegaly.     Vascular: No JVD.     Trachea: No tracheal deviation.  Cardiovascular:     Rate and Rhythm: Normal rate and regular rhythm.     Heart sounds: Normal heart sounds. No murmur heard. No friction rub. No gallop.   Pulmonary:     Effort: Pulmonary effort is normal. No respiratory distress.     Breath sounds: No wheezing or rales.  Chest:     Chest wall: No tenderness.  Abdominal:     General: Bowel sounds are normal.     Palpations: Abdomen is soft.     Tenderness: There is no abdominal tenderness. There is right CVA tenderness. There is no left CVA tenderness.  Musculoskeletal:        General: Normal range of motion.     Cervical back: Normal range of motion and neck supple.  Lymphadenopathy:     Cervical: No cervical adenopathy.  Skin:    General: Skin is warm and dry.  Neurological:     Mental Status: She is alert and oriented to person, place, and time.     Cranial Nerves: No cranial nerve deficit.  Psychiatric:        Behavior: Behavior normal.        Thought Content: Thought content normal.        Judgment: Judgment normal.       Assessment/Plan: 1. Urinary tract infection without hematuria, site unspecified Urine culture will be sent. Pt failed azo, will start on Macrobid and adjust pending C/S. - UA/M w/rflx Culture, Routine - nitrofurantoin, macrocrystal-monohydrate, (MACROBID) 100 MG capsule; Take 1 cap twice per day for 10 days.  Dispense: 20 capsule; Refill: 0  2. Mild persistent asthma, uncomplicated Continue inhalers as prescribed.  3. Wheezing - PROAIR HFA 108 (90 Base)  MCG/ACT inhaler; Inhale 2 puffs into the lungs every 6 (six) hours as needed for wheezing or shortness of breath.  Dispense: 18 g; Refill: 5  General Counseling: Jacklyne verbalizes understanding of the findings of todays visit and agrees with plan of treatment. I have discussed any further diagnostic evaluation that may be needed or ordered today. We also reviewed her medications today. she has been encouraged to call the office with any questions or concerns that should arise related to todays visit.    Counseling:    Orders Placed This Encounter  Procedures  . UA/M w/rflx Culture, Routine    Meds ordered this encounter  Medications  . DISCONTD: nitrofurantoin, macrocrystal-monohydrate, (MACROBID) 100 MG capsule    Sig: Take  1 cap twice per day for 10 days.    Dispense:  20 capsule    Refill:  0  . nitrofurantoin, macrocrystal-monohydrate, (MACROBID) 100 MG capsule    Sig: Take 1 cap twice per day for 10 days.    Dispense:  20 capsule    Refill:  0  . PROAIR HFA 108 (90 Base) MCG/ACT inhaler    Sig: Inhale 2 puffs into the lungs every 6 (six) hours as needed for wheezing or shortness of breath.    Dispense:  18 g    Refill:  5    Time spent:30 Minutes

## 2021-01-27 LAB — UA/M W/RFLX CULTURE, ROUTINE
Bilirubin, UA: NEGATIVE
Glucose, UA: NEGATIVE
Ketones, UA: NEGATIVE
Nitrite, UA: POSITIVE — AB
RBC, UA: NEGATIVE
Specific Gravity, UA: 1.028 (ref 1.005–1.030)
Urobilinogen, Ur: 1 mg/dL (ref 0.2–1.0)
pH, UA: 5.5 (ref 5.0–7.5)

## 2021-01-27 LAB — URINE CULTURE, REFLEX

## 2021-01-27 LAB — MICROSCOPIC EXAMINATION
Casts: NONE SEEN /lpf
Epithelial Cells (non renal): >10 /hpf — AB (ref 0–10)
RBC, Urine: NONE SEEN /hpf (ref 0–2)

## 2021-02-07 ENCOUNTER — Other Ambulatory Visit: Payer: Self-pay | Admitting: Internal Medicine

## 2021-02-07 DIAGNOSIS — Z1231 Encounter for screening mammogram for malignant neoplasm of breast: Secondary | ICD-10-CM

## 2021-03-24 ENCOUNTER — Encounter: Payer: 59 | Admitting: Nurse Practitioner

## 2021-03-25 ENCOUNTER — Other Ambulatory Visit: Payer: Self-pay

## 2021-03-25 ENCOUNTER — Ambulatory Visit
Admission: RE | Admit: 2021-03-25 | Discharge: 2021-03-25 | Disposition: A | Discharge status: Home / Self Care | Payer: 59 | Source: Ambulatory Visit | Attending: Internal Medicine | Admitting: Internal Medicine

## 2021-03-25 DIAGNOSIS — Z1231 Encounter for screening mammogram for malignant neoplasm of breast: Secondary | ICD-10-CM | POA: Diagnosis present

## 2021-04-15 ENCOUNTER — Ambulatory Visit (INDEPENDENT_AMBULATORY_CARE_PROVIDER_SITE_OTHER): Payer: 59 | Admitting: Nurse Practitioner

## 2021-04-15 ENCOUNTER — Other Ambulatory Visit: Payer: Self-pay

## 2021-04-15 ENCOUNTER — Encounter: Payer: Self-pay | Admitting: Nurse Practitioner

## 2021-04-15 VITALS — BP 129/93 | HR 73 | Temp 98.4°F | Resp 16 | Ht 63.0 in | Wt 312.4 lb

## 2021-04-15 DIAGNOSIS — E559 Vitamin D deficiency, unspecified: Secondary | ICD-10-CM | POA: Diagnosis not present

## 2021-04-15 DIAGNOSIS — I10 Essential (primary) hypertension: Secondary | ICD-10-CM | POA: Diagnosis not present

## 2021-04-15 DIAGNOSIS — Z23 Encounter for immunization: Secondary | ICD-10-CM

## 2021-04-15 DIAGNOSIS — Z6841 Body Mass Index (BMI) 40.0 and over, adult: Secondary | ICD-10-CM

## 2021-04-15 DIAGNOSIS — R300 Dysuria: Secondary | ICD-10-CM

## 2021-04-15 DIAGNOSIS — J4530 Mild persistent asthma, uncomplicated: Secondary | ICD-10-CM

## 2021-04-15 DIAGNOSIS — N951 Menopausal and female climacteric states: Secondary | ICD-10-CM

## 2021-04-15 DIAGNOSIS — Z0001 Encounter for general adult medical examination with abnormal findings: Secondary | ICD-10-CM

## 2021-04-15 LAB — POCT URINALYSIS DIPSTICK
Bilirubin, UA: NEGATIVE
Glucose, UA: NEGATIVE
Ketones, UA: NEGATIVE
Nitrite, UA: NEGATIVE
Protein, UA: NEGATIVE
Spec Grav, UA: 1.02 (ref 1.010–1.025)
Urobilinogen, UA: 0.2 E.U./dL
pH, UA: 6 (ref 5.0–8.0)

## 2021-04-15 MED ORDER — ERGOCALCIFEROL 1.25 MG (50000 UT) PO CAPS: 50000.0000 [IU] | ORAL_CAPSULE | ORAL | 5 refills | Status: DC

## 2021-04-15 MED ORDER — ZOSTER VAC RECOMB ADJUVANTED 50 MCG/0.5ML IM SUSR: 0.5000 mL | Freq: Once | INTRAMUSCULAR | 0 refills | Status: AC

## 2021-04-15 MED ORDER — FLUTICASONE-SALMETEROL 250-50 MCG/ACT IN AEPB: 1.0000 | INHALATION_SPRAY | Freq: Two times a day (BID) | RESPIRATORY_TRACT | 1 refills | Status: DC

## 2021-04-15 MED ORDER — FUROSEMIDE 20 MG PO TABS: 20.0000 mg | ORAL_TABLET | Freq: Every day | ORAL | 1 refills | Status: DC

## 2021-04-15 NOTE — Progress Notes (Signed)
[facility] [address] [place], [place] [address]  Internal MEDICINE  Office Visit Note  Patient Name: Victoria Castaneda  [id number]  [id number]  Date of Service: 04/15/2021  Chief Complaint  Patient presents with   Annual Exam    Lower back pain right side dull pain, med review, med refill,    HPI Narely presents for an annual well visit and physical exam. she has a history of arthritis, asthma, mild persistent asthma, vitamin D deficiency, primary ovarian failure, cholecystectomy, hysterectomy, and hyperlipidemia. She lives at home alone. She has 1 adult daughter and a 37 yo grandson. She works at [employer]. Her pap is due in April 2024. She had her mammogram done in June 2022 and the result was BI-RADS 1 negative. Her screening colonoscopy is due in 2027. She has received 2 doses of the COVID vaccine and 1 booster dose. Her diet is poor, she drinks juice everyday and likes to eat high sugar foods. Her current concern is having recurrent UTIs, back pain which she thinks is related to UTIs and she wants help with weight loss. She is a nonsmoker, rarely drinks alcohol and denies recreational drug use.     Current Medication: Outpatient Encounter Medications as of 04/15/2021  Medication Sig   albuterol (PROVENTIL) (2.5 MG/3ML) 0.083% nebulizer solution Take 2.5 mg by nebulization every 6 (six) hours as needed for wheezing or shortness of breath.   fluticasone (FLONASE) 50 MCG/ACT nasal spray Place into both nostrils daily.   fluticasone-salmeterol (ADVAIR) 250-50 MCG/ACT AEPB Inhale 1 puff into the lungs in the morning and at bedtime.   ipratropium-albuterol (DUONEB) 0.5-2.5 (3) MG/3ML SOLN Take 3 mLs by nebulization every 6 (six) hours as needed.   PROAIR HFA 108 (90 Base) MCG/ACT inhaler Inhale 2 puffs into the lungs every 6 (six) hours as needed for wheezing or shortness of breath.   Zoster Vaccine Adjuvanted [facility]) injection Inject 0.5 mLs into the muscle once for 1 dose.    [DISCONTINUED] ergocalciferol (DRISDOL) 1.25 MG (50000 UT) capsule Take 1 capsule (50,000 Units total) by mouth once a week.   [DISCONTINUED] Fluticasone-Salmeterol (ADVAIR) 250-50 MCG/DOSE AEPB Inhale 1 puff into the lungs 2 (two) times daily.   [DISCONTINUED] furosemide (LASIX) 20 MG tablet Take 1 tablet (20 mg total) by mouth daily.   ergocalciferol (DRISDOL) 1.25 MG (50000 UT) capsule Take 1 capsule (50,000 Units total) by mouth once a week.   furosemide (LASIX) 20 MG tablet Take 1 tablet (20 mg total) by mouth daily.   HYDROcodone-acetaminophen (NORCO/VICODIN) 5-325 MG tablet Take 1 tablet by mouth 3 (three) times daily as needed for moderate pain. (Patient not taking: Reported on 04/15/2021)   ibuprofen (ADVIL) 800 MG tablet Take 1 tablet (800 mg total) by mouth every 8 (eight) hours as needed. (Patient not taking: Reported on 04/15/2021)   nitrofurantoin, macrocrystal-monohydrate, (MACROBID) 100 MG capsule Take 1 cap twice per day for 10 days. (Patient not taking: Reported on 04/15/2021)   No facility-administered encounter medications on file as of 04/15/2021.    Surgical History: Past Surgical History:  Procedure Laterality Date   ABDOMINAL HYSTERECTOMY     CHOLECYSTECTOMY     COLONOSCOPY WITH PROPOFOL N/A 12/10/2015   Procedure: COLONOSCOPY WITH PROPOFOL;  Surgeon: Midge Minium, MD;  Location: [facility];  Service: Endoscopy;  Laterality: N/A;    Medical History: Past Medical History:  Diagnosis Date   Arthritis    Asthma    Diverticulitis 06/2017   Headache    Hypersomnia,  unspecified    Mild persistent asthma    Mixed hyperlipidemia    Motion sickness    Primary ovarian failure    Uncomplicated asthma    Vitamin D deficiency, unspecified    Wheezing     Family History: Family History  Problem Relation Age of Onset   Diabetes Mother    Breast cancer Paternal Aunt     Social History   Socioeconomic History   Marital status: Single    Spouse name: Not on file    Number of children: Not on file   Years of education: Not on file   Highest education level: Not on file  Occupational History   Not on file  Tobacco Use   Smoking status: Never   Smokeless tobacco: Never  Substance and Sexual Activity   Alcohol use: Yes    Comment: occasionally   Drug use: No   Sexual activity: Not on file  Other Topics Concern   Not on file  Social History Narrative   Not on file   Social Determinants of Health   Financial Resource Strain: Not on file  Food Insecurity: Not on file  Transportation Needs: Not on file  Physical Activity: Not on file  Stress: Not on file  Social Connections: Not on file  Intimate Partner Violence: Not on file      Review of Systems  Constitutional:  Negative for activity change, appetite change, chills, fatigue, fever and unexpected weight change.  HENT: Negative.  Negative for congestion, ear pain, rhinorrhea, sore throat and trouble swallowing.   Eyes: Negative.   Respiratory: Negative.  Negative for cough, chest tightness, shortness of breath and wheezing.   Cardiovascular: Negative.  Negative for chest pain.  Gastrointestinal: Negative.  Negative for abdominal pain, blood in stool, constipation, diarrhea, nausea and vomiting.  Endocrine: Negative.   Genitourinary: Negative.  Negative for difficulty urinating, dysuria, frequency, hematuria and urgency.  Musculoskeletal: Negative.  Negative for arthralgias, back pain, joint swelling, myalgias and neck pain.  Skin: Negative.  Negative for rash and wound.  Allergic/Immunologic: Negative.  Negative for immunocompromised state.  Neurological: Negative.  Negative for dizziness, seizures, numbness and headaches.  Hematological: Negative.   Psychiatric/Behavioral: Negative.  Negative for behavioral problems, self-injury and suicidal ideas. The patient is not nervous/anxious.    Vital Signs: BP (!) 129/93   Pulse 73   Temp 98.4 F (36.9 C)   Resp 16   Ht  (1.6 m)    Wt (!) 312 lb 6.4 oz (141.7 kg)   SpO2 94%   BMI 55.34 kg/m    Physical Exam Vitals reviewed.  Constitutional:      General: She is not in acute distress.    Appearance: Normal appearance. She is obese. She is not ill-appearing.  HENT:     Head: Normocephalic and atraumatic.     Right Ear: Tympanic membrane, ear canal and external ear normal.     Left Ear: Tympanic membrane, ear canal and external ear normal.     Nose: Nose normal. No congestion or rhinorrhea.     Mouth/Throat:     Mouth: Mucous membranes are moist.     Pharynx: Oropharynx is clear. No posterior oropharyngeal erythema.  Eyes:     Conjunctiva/sclera: Conjunctivae normal.     Pupils: Pupils are equal, round, and reactive to light.  Cardiovascular:     Rate and Rhythm: Normal rate and regular rhythm.     Pulses: Normal pulses.     Heart sounds:  Normal heart sounds. No murmur heard. Pulmonary:     Effort: Pulmonary effort is normal. No respiratory distress.     Breath sounds: Normal breath sounds. No wheezing.  Abdominal:     General: Bowel sounds are normal. There is no distension.     Palpations: Abdomen is soft. There is no mass.     Tenderness: There is no abdominal tenderness. There is no guarding or rebound.     Hernia: No hernia is present.  Musculoskeletal:        General: Normal range of motion.     Cervical back: Normal range of motion and neck supple.     Right lower leg: No edema.     Left lower leg: No edema.  Lymphadenopathy:     Cervical: No cervical adenopathy.  Skin:    General: Skin is warm and dry.     Capillary Refill: Capillary refill takes less than 2 seconds.  Neurological:     Mental Status: She is alert and oriented to person, place, and time.  Psychiatric:        Mood and Affect: Mood normal.        Behavior: Behavior normal.        Thought Content: Thought content normal.        Judgment: Judgment normal.       Assessment/Plan: 1. Encounter for general adult medical  examination with abnormal findings Age-appropriate preventive screenings discussed, annual physical exam completed. Routine labs for health maintenance were done previously.    2. Essential (primary) hypertension Diastolic blood pressure is a little elevated, will reevaluate at next office visit in approximately 1 month. Lasix refill ordered. - furosemide (LASIX) 20 MG tablet; Take 1 tablet (20 mg total) by mouth daily.  Dispense: 90 tablet; Refill: 1  3. Perimenopause Patient is requesting to have her hormones checked because she thinks that she is in perimenopause.  - FSH/LH - Estradiol - Progesterone  4. Mild persistent asthma, uncomplicated Stable, advair refill ordered.  - fluticasone-salmeterol (ADVAIR) 250-50 MCG/ACT AEPB; Inhale 1 puff into the lungs in the morning and at bedtime.  Dispense: 180 each; Refill: 1  5. Vitamin D deficiency Low vitamin D, refill ordered. - ergocalciferol (DRISDOL) 1.25 MG (50000 UT) capsule; Take 1 capsule (50,000 Units total) by mouth once a week.  Dispense: 4 capsule; Refill: 5  6. Encounter for vaccination Shingles vaccine order sent to pharmacy - Zoster Vaccine Adjuvanted [facility]) injection; Inject 0.5 mLs into the muscle once for 1 dose.  Dispense: 0.5 mL; Refill: 0  7. Dysuria Routine urinalysis dipstick done in office, urine sent for culture due to history of recurrent UTIs.  - CULTURE, URINE COMPREHENSIVE - POCT Urinalysis Dipstick  8. Morbid obesity with BMI of 50.0-59.9, adult (HCC) Current weight is 312 lbs and BMI is 55.34. She is interested in getting help with weight loss. Will follow up in 1 month for a weight loss visit and she will do the metabolic test and we will discuss diet, lifestyle modifications, and her weight loss goals.    General Counseling: tannia contino understanding of the findings of todays visit and agrees with plan of treatment. I have discussed any further diagnostic evaluation that may be needed or  ordered today. We also reviewed her medications today. she has been encouraged to call the office with any questions or concerns that should arise related to todays visit.    Orders Placed This Encounter  Procedures   CULTURE, URINE COMPREHENSIVE   FSH/LH  Estradiol   Progesterone   POCT Urinalysis Dipstick    Meds ordered this encounter  Medications   ergocalciferol (DRISDOL) 1.25 MG (50000 UT) capsule    Sig: Take 1 capsule (50,000 Units total) by mouth once a week.    Dispense:  4 capsule    Refill:  5   furosemide (LASIX) 20 MG tablet    Sig: Take 1 tablet (20 mg total) by mouth daily.    Dispense:  90 tablet    Refill:  1   fluticasone-salmeterol (ADVAIR) 250-50 MCG/ACT AEPB    Sig: Inhale 1 puff into the lungs in the morning and at bedtime.    Dispense:  180 each    Refill:  1   Zoster Vaccine Adjuvanted [facility]) injection    Sig: Inject 0.5 mLs into the muscle once for 1 dose.    Dispense:  0.5 mL    Refill:  0    Return in about 1 month (around 05/16/2021) for F/U, Weight loss and do metabolic test, Keonna Raether PCP.   Total time spent:30 Minutes Time spent includes review of chart, medications, test results, and follow up plan with the patient.   [place] Controlled Substance Database was reviewed by me.  This patient was seen by Sallyanne Kuster, FNP-C in collaboration with Dr. Beverely Risen as a part of collaborative care agreement.  Joelyn Lover R. Tedd Sias, MSN, FNP-C Internal medicine

## 2021-04-17 LAB — SPECIMEN STATUS REPORT

## 2021-04-19 ENCOUNTER — Encounter: Payer: Self-pay | Admitting: Internal Medicine

## 2021-04-19 ENCOUNTER — Other Ambulatory Visit: Payer: Self-pay

## 2021-04-19 ENCOUNTER — Telehealth: Payer: Self-pay

## 2021-04-19 ENCOUNTER — Ambulatory Visit: Payer: 59 | Admitting: Internal Medicine

## 2021-04-19 DIAGNOSIS — E2839 Other primary ovarian failure: Secondary | ICD-10-CM | POA: Diagnosis not present

## 2021-04-19 DIAGNOSIS — Z0001 Encounter for general adult medical examination with abnormal findings: Secondary | ICD-10-CM

## 2021-04-19 DIAGNOSIS — R300 Dysuria: Secondary | ICD-10-CM | POA: Diagnosis not present

## 2021-04-19 DIAGNOSIS — M5489 Other dorsalgia: Secondary | ICD-10-CM | POA: Diagnosis not present

## 2021-04-19 DIAGNOSIS — J4531 Mild persistent asthma with (acute) exacerbation: Secondary | ICD-10-CM

## 2021-04-19 DIAGNOSIS — I10 Essential (primary) hypertension: Secondary | ICD-10-CM

## 2021-04-19 LAB — POCT URINALYSIS DIPSTICK
Bilirubin, UA: NEGATIVE
Glucose, UA: NEGATIVE
Nitrite, UA: NEGATIVE
Protein, UA: POSITIVE — AB
Spec Grav, UA: 1.01 (ref 1.010–1.025)
Urobilinogen, UA: 0.2 E.U./dL
pH, UA: 6.5 (ref 5.0–8.0)

## 2021-04-19 LAB — CULTURE, URINE COMPREHENSIVE

## 2021-04-19 MED ORDER — ZOSTER VAC RECOMB ADJUVANTED 50 MCG/0.5ML IM SUSR: 0.5000 mL | Freq: Once | INTRAMUSCULAR | 0 refills | Status: AC

## 2021-04-19 MED ORDER — CYCLOBENZAPRINE HCL 10 MG PO TABS: 10.0000 mg | ORAL_TABLET | Freq: Every day | ORAL | 1 refills | Status: DC

## 2021-04-19 MED ORDER — MELOXICAM 15 MG PO TABS: 15.0000 mg | ORAL_TABLET | Freq: Every day | ORAL | 1 refills | Status: DC

## 2021-04-19 MED ORDER — CIPROFLOXACIN HCL 500 MG PO TABS: ORAL_TABLET | ORAL | 0 refills | Status: DC

## 2021-04-19 NOTE — Progress Notes (Signed)
[facility] [address] [place], [place] [address]  Internal MEDICINE  Office Visit Note  Patient Name: Victoria Castaneda  [id number]  [id number]  Date of Service: 04/23/2021  Chief Complaint  Patient presents with   Acute Visit    Poss uti   Quality Metric Gaps    Tdap, HIV screening,   Urinary Tract Infection   Back Pain    Lower     HPI  Patient is here for acute and sick visit previously she also has been seen for the same symptoms and thought to be UTI however upon reviewing her culture she does not have any acute or chronic or recurrent urinary tract infection. She complains of left-sided pain with radiation to her hip area. Patient also struggles from her weight will be interested in getting some more information about weight management. Patient is menopausal will be due for bone density. She would like to be treated empirically until her cultures and sensitivities available     Current Medication: Outpatient Encounter Medications as of 04/19/2021  Medication Sig   ciprofloxacin (CIPRO) 500 MG tablet Take one tab po bid for 5 days   cyclobenzaprine (FLEXERIL) 10 MG tablet Take 1 tablet (10 mg total) by mouth at bedtime. Take one tab po qhs for back spasm prn only   meloxicam (MOBIC) 15 MG tablet Take 1 tablet (15 mg total) by mouth daily.   [DISCONTINUED] Zoster Vaccine Adjuvanted [facility]) injection Inject 0.5 mLs into the muscle once.   albuterol (PROVENTIL) (2.5 MG/3ML) 0.083% nebulizer solution Take 2.5 mg by nebulization every 6 (six) hours as needed for wheezing or shortness of breath.   ergocalciferol (DRISDOL) 1.25 MG (50000 UT) capsule Take 1 capsule (50,000 Units total) by mouth once a week.   fluticasone (FLONASE) 50 MCG/ACT nasal spray Place into both nostrils daily.   fluticasone-salmeterol (ADVAIR) 250-50 MCG/ACT AEPB Inhale 1 puff into the lungs in the morning and at bedtime.   furosemide (LASIX) 20 MG tablet Take 1 tablet (20 mg total) by mouth  daily.   HYDROcodone-acetaminophen (NORCO/VICODIN) 5-325 MG tablet Take 1 tablet by mouth 3 (three) times daily as needed for moderate pain. (Patient not taking: Reported on 04/15/2021)   ibuprofen (ADVIL) 800 MG tablet Take 1 tablet (800 mg total) by mouth every 8 (eight) hours as needed. (Patient not taking: Reported on 04/15/2021)   ipratropium-albuterol (DUONEB) 0.5-2.5 (3) MG/3ML SOLN Take 3 mLs by nebulization every 6 (six) hours as needed.   nitrofurantoin, macrocrystal-monohydrate, (MACROBID) 100 MG capsule Take 1 cap twice per day for 10 days. (Patient not taking: Reported on 04/15/2021)   PROAIR HFA 108 (90 Base) MCG/ACT inhaler Inhale 2 puffs into the lungs every 6 (six) hours as needed for wheezing or shortness of breath.   No facility-administered encounter medications on file as of 04/19/2021.    Surgical History: Past Surgical History:  Procedure Laterality Date   ABDOMINAL HYSTERECTOMY     CHOLECYSTECTOMY     COLONOSCOPY WITH PROPOFOL N/A 12/10/2015   Procedure: COLONOSCOPY WITH PROPOFOL;  Surgeon: Midge Minium, MD;  Location: [facility];  Service: Endoscopy;  Laterality: N/A;    Medical History: Past Medical History:  Diagnosis Date   Arthritis    Asthma    Diverticulitis 06/2017   Headache    Hypersomnia, unspecified    Mild persistent asthma    Mixed hyperlipidemia    Motion sickness    Primary ovarian failure    Uncomplicated asthma    Vitamin D  deficiency, unspecified    Wheezing     Family History: Family History  Problem Relation Age of Onset   Diabetes Mother    Breast cancer Paternal Aunt     Social History   Socioeconomic History   Marital status: Single    Spouse name: Not on file   Number of children: Not on file   Years of education: Not on file   Highest education level: Not on file  Occupational History   Not on file  Tobacco Use   Smoking status: Never   Smokeless tobacco: Never  Substance and Sexual Activity   Alcohol use: Yes     Comment: occasionally   Drug use: No   Sexual activity: Not on file  Other Topics Concern   Not on file  Social History Narrative   Not on file   Social Determinants of Health   Financial Resource Strain: Not on file  Food Insecurity: Not on file  Transportation Needs: Not on file  Physical Activity: Not on file  Stress: Not on file  Social Connections: Not on file  Intimate Partner Violence: Not on file      Review of Systems  Constitutional:  Negative for chills, fatigue and unexpected weight change.  HENT:  Negative for congestion, postnasal drip, rhinorrhea, sneezing and sore throat.   Eyes:  Negative for redness.  Respiratory:  Negative for cough, chest tightness and shortness of breath.   Cardiovascular:  Negative for chest pain and palpitations.  Gastrointestinal:  Negative for abdominal pain, constipation, diarrhea, nausea and vomiting.  Genitourinary:  Positive for flank pain. Negative for dysuria and frequency.  Musculoskeletal:  Negative for arthralgias, back pain, joint swelling and neck pain.  Skin:  Negative for rash.  Neurological:  Negative for tremors, weakness and numbness.  Hematological:  Negative for adenopathy. Does not bruise/bleed easily.  Psychiatric/Behavioral:  Negative for behavioral problems (Depression), sleep disturbance and suicidal ideas. The patient is not nervous/anxious.    Vital Signs: BP 128/82   Pulse 93   Temp 98.5 F (36.9 C)   Resp 16   Ht 5\' 3"  (1.6 m)   Wt (!) 313 lb (142 kg)   SpO2 96%   BMI 55.45 kg/m    Physical Exam Constitutional:      Appearance: Normal appearance.  HENT:     Head: Normocephalic and atraumatic.     Nose: Nose normal.     Mouth/Throat:     Mouth: Mucous membranes are moist.     Pharynx: No posterior oropharyngeal erythema.  Eyes:     Extraocular Movements: Extraocular movements intact.     Pupils: Pupils are equal, round, and reactive to light.  Cardiovascular:     Rate and Rhythm:  Regular rhythm.     Pulses: Normal pulses.     Heart sounds: Normal heart sounds.  Pulmonary:     Effort: Pulmonary effort is normal.     Breath sounds: Normal breath sounds.  Musculoskeletal:        General: Tenderness present.     Comments: Left hip   Neurological:     General: No focal deficit present.     Mental Status: She is alert.  Psychiatric:        Mood and Affect: Mood normal.        Behavior: Behavior normal.       Assessment/Plan: 1. Left paraspinal back pain On examination it seems to be more of a musculoskeletal problem we will start with meloxicam  and muscle relaxer as prescribed today might need x-ray and MRI - CBC with Differential/Platelet - meloxicam (MOBIC) 15 MG tablet; Take 1 tablet (15 mg total) by mouth daily.  Dispense: 30 tablet; Refill: 1 - cyclobenzaprine (FLEXERIL) 10 MG tablet; Take 1 tablet (10 mg total) by mouth at bedtime. Take one tab po qhs for back spasm prn only  Dispense: 30 tablet; Refill: 1  2. Essential (primary) hypertension Monitor blood pressure at home - CBC with Differential/Platelet - Lipid Panel With LDL/HDL Ratio - TSH - T4, free - Comprehensive metabolic panel  3. Other primary ovarian failure Patient will be due for bone density - CBC with Differential/Platelet - TSH - T4, free - Comprehensive metabolic panel  4. Mild persistent asthma with exacerbation Stable and controlled  5. Dysuria Treat with Cipro 570 for cultures and sensitivity for now - POCT Urinalysis Dipstick - CULTURE, URINE COMPREHENSIVE - CBC with Differential/Platelet - Lipid Panel With LDL/HDL Ratio - TSH - T4, free - Comprehensive metabolic panel - ciprofloxacin (CIPRO) 500 MG tablet; Take one tab po bid for 5 days  Dispense: 10 tablet; Refill: 0   General Counseling: jozie wulf understanding of the findings of todays visit and agrees with plan of treatment. I have discussed any further diagnostic evaluation that may be needed or  ordered today. We also reviewed her medications today. she has been encouraged to call the office with any questions or concerns that should arise related to todays visit.    Orders Placed This Encounter  Procedures   CULTURE, URINE COMPREHENSIVE   CBC with Differential/Platelet   Lipid Panel With LDL/HDL Ratio   TSH   T4, free   Comprehensive metabolic panel   POCT Urinalysis Dipstick    Meds ordered this encounter  Medications   ciprofloxacin (CIPRO) 500 MG tablet    Sig: Take one tab po bid for 5 days    Dispense:  10 tablet    Refill:  0   meloxicam (MOBIC) 15 MG tablet    Sig: Take 1 tablet (15 mg total) by mouth daily.    Dispense:  30 tablet    Refill:  1   cyclobenzaprine (FLEXERIL) 10 MG tablet    Sig: Take 1 tablet (10 mg total) by mouth at bedtime. Take one tab po qhs for back spasm prn only    Dispense:  30 tablet    Refill:  1    Total time spent:35 Minutes Time spent includes review of chart, medications, test results, and follow up plan with the patient.   [place] Controlled Substance Database was reviewed by me.   Dr Lyndon Code Internal medicine

## 2021-04-19 NOTE — Telephone Encounter (Signed)
Call patient she is aware of her culture results.LNB

## 2021-04-22 LAB — CULTURE, URINE COMPREHENSIVE

## 2021-05-20 ENCOUNTER — Ambulatory Visit: Payer: 59 | Admitting: Nurse Practitioner

## 2021-08-03 ENCOUNTER — Other Ambulatory Visit: Payer: Self-pay

## 2021-08-03 ENCOUNTER — Encounter: Payer: Self-pay | Admitting: Nurse Practitioner

## 2021-08-03 ENCOUNTER — Ambulatory Visit: Payer: 59 | Admitting: Nurse Practitioner

## 2021-08-03 ENCOUNTER — Telehealth: Payer: Self-pay

## 2021-08-03 VITALS — BP 140/80 | HR 85 | Temp 98.1°F | Resp 16 | Ht 62.0 in | Wt 313.4 lb

## 2021-08-03 DIAGNOSIS — Z23 Encounter for immunization: Secondary | ICD-10-CM

## 2021-08-03 DIAGNOSIS — M545 Low back pain, unspecified: Secondary | ICD-10-CM | POA: Diagnosis not present

## 2021-08-03 DIAGNOSIS — Z6841 Body Mass Index (BMI) 40.0 and over, adult: Secondary | ICD-10-CM

## 2021-08-03 DIAGNOSIS — I10 Essential (primary) hypertension: Secondary | ICD-10-CM | POA: Diagnosis not present

## 2021-08-03 DIAGNOSIS — G8929 Other chronic pain: Secondary | ICD-10-CM

## 2021-08-03 DIAGNOSIS — E559 Vitamin D deficiency, unspecified: Secondary | ICD-10-CM | POA: Diagnosis not present

## 2021-08-03 MED ORDER — TETANUS-DIPHTH-ACELL PERTUSSIS 5-2.5-18.5 LF-MCG/0.5 IM SUSP: 0.5000 mL | Freq: Once | INTRAMUSCULAR | 0 refills | Status: AC

## 2021-08-03 MED ORDER — ERGOCALCIFEROL 1.25 MG (50000 UT) PO CAPS: 50000.0000 [IU] | ORAL_CAPSULE | ORAL | 5 refills | Status: DC

## 2021-08-03 MED ORDER — ZOSTER VAC RECOMB ADJUVANTED 50 MCG/0.5ML IM SUSR: 0.5000 mL | Freq: Once | INTRAMUSCULAR | 0 refills | Status: AC

## 2021-08-03 MED ORDER — TIZANIDINE HCL 4 MG PO TABS: 4.0000 mg | ORAL_TABLET | Freq: Every evening | ORAL | 0 refills | Status: DC | PRN

## 2021-08-03 NOTE — Progress Notes (Signed)
[facility] [address] [place], [place] [address]  Internal MEDICINE  Office Visit Note  Patient Name: Victoria Castaneda  [id number]  [id number]  Date of Service: 08/03/2021  Chief Complaint  Patient presents with   Follow-up    Refills, lower back pain, has been going on for months now but has gotten worse    HPI [name] presents for a follow up visit for medication refills, hypertension and right sided low back pain. She reports having right-sided low back pain off and on for the past several months but it has started getting worse recently. The area mostly hurts at night and interrupts her sleep. She states that it does not hurt during the day when she is walking around or sitting down. She denies any sciatica or radiation of the pain and denies any pain in the right hip. She has not had any recent imaging of the lumbar spine.  -her blood pressure was initially elevated during her office visit but improved when rechecked, see vitals.     Current Medication: Outpatient Encounter Medications as of 08/03/2021  Medication Sig   albuterol (PROVENTIL) (2.5 MG/3ML) 0.083% nebulizer solution Take 2.5 mg by nebulization every 6 (six) hours as needed for wheezing or shortness of breath.   fluticasone (FLONASE) 50 MCG/ACT nasal spray Place into both nostrils daily.   fluticasone-salmeterol (ADVAIR) 250-50 MCG/ACT AEPB Inhale 1 puff into the lungs in the morning and at bedtime.   furosemide (LASIX) 20 MG tablet Take 1 tablet (20 mg total) by mouth daily.   ipratropium-albuterol (DUONEB) 0.5-2.5 (3) MG/3ML SOLN Take 3 mLs by nebulization every 6 (six) hours as needed.   meloxicam (MOBIC) 15 MG tablet Take 1 tablet (15 mg total) by mouth daily.   PROAIR HFA 108 (90 Base) MCG/ACT inhaler Inhale 2 puffs into the lungs every 6 (six) hours as needed for wheezing or shortness of breath.   tiZANidine (ZANAFLEX) 4 MG tablet Take 1 tablet (4 mg total) by mouth at bedtime as needed for muscle  spasms.   [DISCONTINUED] cyclobenzaprine (FLEXERIL) 10 MG tablet Take 1 tablet (10 mg total) by mouth at bedtime. Take one tab po qhs for back spasm prn only   [DISCONTINUED] ergocalciferol (DRISDOL) 1.25 MG (50000 UT) capsule Take 1 capsule (50,000 Units total) by mouth once a week.   [DISCONTINUED] Tdap (BOOSTRIX) 5-2.5-18.5 LF-MCG/0.5 injection Inject 0.5 mLs into the muscle once.   [DISCONTINUED] Zoster Vaccine Adjuvanted [facility]) injection Inject 0.5 mLs into the muscle once.   ergocalciferol (DRISDOL) 1.25 MG (50000 UT) capsule Take 1 capsule (50,000 Units total) by mouth once a week.   Tdap (BOOSTRIX) 5-2.5-18.5 LF-MCG/0.5 injection Inject 0.5 mLs into the muscle once for 1 dose.   Zoster Vaccine Adjuvanted [facility]) injection Inject 0.5 mLs into the muscle once for 1 dose.   [DISCONTINUED] ciprofloxacin (CIPRO) 500 MG tablet Take one tab po bid for 5 days (Patient not taking: Reported on 08/03/2021)   [DISCONTINUED] HYDROcodone-acetaminophen (NORCO/VICODIN) 5-325 MG tablet Take 1 tablet by mouth 3 (three) times daily as needed for moderate pain. (Patient not taking: Reported on 04/15/2021)   [DISCONTINUED] ibuprofen (ADVIL) 800 MG tablet Take 1 tablet (800 mg total) by mouth every 8 (eight) hours as needed. (Patient not taking: Reported on 04/15/2021)   [DISCONTINUED] nitrofurantoin, macrocrystal-monohydrate, (MACROBID) 100 MG capsule Take 1 cap twice per day for 10 days. (Patient not taking: Reported on 04/15/2021)   No facility-administered encounter medications on file as of 08/03/2021.    Surgical History:  Past Surgical History:  Procedure Laterality Date   ABDOMINAL HYSTERECTOMY     CHOLECYSTECTOMY     COLONOSCOPY WITH PROPOFOL N/A 12/10/2015   Procedure: COLONOSCOPY WITH PROPOFOL;  Surgeon: Midge Minium, MD;  Location: [facility];  Service: Endoscopy;  Laterality: N/A;    Medical History: Past Medical History:  Diagnosis Date   Arthritis    Asthma    Diverticulitis  06/2017   Headache    Hypersomnia, unspecified    Mild persistent asthma    Mixed hyperlipidemia    Motion sickness    Primary ovarian failure    Uncomplicated asthma    Vitamin D deficiency, unspecified    Wheezing     Family History: Family History  Problem Relation Age of Onset   Diabetes Mother    Breast cancer Paternal Aunt     Social History   Socioeconomic History   Marital status: Single    Spouse name: Not on file   Number of children: Not on file   Years of education: Not on file   Highest education level: Not on file  Occupational History   Not on file  Tobacco Use   Smoking status: Never   Smokeless tobacco: Never  Substance and Sexual Activity   Alcohol use: Yes    Comment: occasionally   Drug use: No   Sexual activity: Not on file  Other Topics Concern   Not on file  Social History Narrative   Not on file   Social Determinants of Health   Financial Resource Strain: Not on file  Food Insecurity: Not on file  Transportation Needs: Not on file  Physical Activity: Not on file  Stress: Not on file  Social Connections: Not on file  Intimate Partner Violence: Not on file      Review of Systems  Constitutional:  Negative for chills, fatigue and unexpected weight change.  HENT:  Positive for postnasal drip. Negative for congestion, rhinorrhea, sneezing and sore throat.   Eyes:  Negative for redness.  Respiratory:  Negative for cough, chest tightness and shortness of breath.   Cardiovascular:  Negative for chest pain and palpitations.  Gastrointestinal:  Negative for abdominal pain, constipation, diarrhea, nausea and vomiting.  Genitourinary:  Negative for dysuria and frequency.  Musculoskeletal:  Positive for arthralgias, back pain and myalgias. Negative for gait problem, joint swelling and neck pain.       Right-sided low back pain xseveral months, no sciatica, no radiation.   Skin:  Negative for rash.  Neurological: Negative.  Negative for  tremors and numbness.  Hematological:  Negative for adenopathy. Does not bruise/bleed easily.  Psychiatric/Behavioral:  Negative for behavioral problems (Depression), sleep disturbance and suicidal ideas. The patient is not nervous/anxious.    Vital Signs: BP 140/80 Comment: 163/80  Pulse 85   Temp 98.1 F (36.7 C)   Resp 16   Ht 5\' 2"  (1.575 m)   Wt (!) 313 lb 6.4 oz (142.2 kg)   SpO2 97%   BMI 57.32 kg/m    Physical Exam Vitals reviewed.  Constitutional:      General: She is awake. She is not in acute distress.    Appearance: Normal appearance. She is well-developed and well-groomed. She is morbidly obese. She is not ill-appearing.  HENT:     Head: Normocephalic and atraumatic.  Eyes:     Extraocular Movements: Extraocular movements intact.     Pupils: Pupils are equal, round, and reactive to light.  Cardiovascular:  Rate and Rhythm: Normal rate and regular rhythm.  Pulmonary:     Effort: Pulmonary effort is normal. No respiratory distress.  Musculoskeletal:     Lumbar back: Spasms (mostly at night) and tenderness (right sided tenderness with palpation.) present. No swelling, edema, deformity, signs of trauma, lacerations or bony tenderness. Normal range of motion. No scoliosis.  Neurological:     Mental Status: She is alert.  Psychiatric:        Mood and Affect: Mood normal.        Behavior: Behavior normal. Behavior is cooperative.       Assessment/Plan: 1. Essential hypertension Blood pressure is stable with current medications.   2. Chronic right-sided low back pain without sciatica Patient has meloxicam 15 mg tablets at home which she has not been taken. Patient was encouraged to take meloxicam daily to help with inflammation and pain. Cyclobenzaprine discontinued, tizanidine prescribed to take at bedtime to help with muscle spasms and musculoskeletal pain which is significantly worse at night. Discussed next steps with patient, recommended xray of lumbar  spine and possible consult with orthopedic. She was interested in seeing an orthopedic specialist. Patient was agreeable to scheduling an appt with [facility]. She was able to get an appointment with Cam Hai PA-C at 10 am this morning. Their facility also has radiology available on-site.  - tiZANidine (ZANAFLEX) 4 MG tablet; Take 1 tablet (4 mg total) by mouth at bedtime as needed for muscle spasms.  Dispense: 30 tablet; Refill: 0  3. Vitamin D deficiency Vitamin D level in 2020 was 15.0. Her level has not been repeated since then. She continues to take the weekly 50,000 unit vitamin D supplement. Will repeat the vitamin D level.  - ergocalciferol (DRISDOL) 1.25 MG (50000 UT) capsule; Take 1 capsule (50,000 Units total) by mouth once a week.  Dispense: 4 capsule; Refill: 5 - Vitamin D (25 hydroxy)  4. Morbid obesity with BMI of 50.0-59.9, adult (HCC) BMI is 57.32 today, weight is 313 lbs. She is interested in getting help with weight loss. Will schedule a follow up visit to do a metabolic test and discuss diet and lifestyle modifications in detail.   5. Encounter for vaccination - Zoster Vaccine Adjuvanted [facility]) injection; Inject 0.5 mLs into the muscle once for 1 dose.  Dispense: 0.5 mL; Refill: 0 - Tdap (BOOSTRIX) 5-2.5-18.5 LF-MCG/0.5 injection; Inject 0.5 mLs into the muscle once for 1 dose.  Dispense: 0.5 mL; Refill: 0   General Counseling: nahima ales understanding of the findings of todays visit and agrees with plan of treatment. I have discussed any further diagnostic evaluation that may be needed or ordered today. We also reviewed her medications today. she has been encouraged to call the office with any questions or concerns that should arise related to todays visit.    No orders of the defined types were placed in this encounter.   Meds ordered this encounter  Medications   Zoster Vaccine Adjuvanted [facility]) injection    Sig: Inject 0.5 mLs into the muscle once  for 1 dose.    Dispense:  0.5 mL    Refill:  0   Tdap (BOOSTRIX) 5-2.5-18.5 LF-MCG/0.5 injection    Sig: Inject 0.5 mLs into the muscle once for 1 dose.    Dispense:  0.5 mL    Refill:  0   tiZANidine (ZANAFLEX) 4 MG tablet    Sig: Take 1 tablet (4 mg total) by mouth at bedtime as needed for muscle spasms.  Dispense:  30 tablet    Refill:  0   ergocalciferol (DRISDOL) 1.25 MG (50000 UT) capsule    Sig: Take 1 capsule (50,000 Units total) by mouth once a week.    Dispense:  4 capsule    Refill:  5    Return in about 3 months (around 11/03/2021) for F/U, Laterica Matarazzo PCP. Follow up with [facility] at 10am today   Total time spent:30 Minutes Time spent includes review of chart, medications, test results, and follow up plan with the patient.   [place] Controlled Substance Database was reviewed by me.  This patient was seen by Sallyanne Kuster, FNP-C in collaboration with Dr. Beverely Risen as a part of collaborative care agreement.   Danisa Kopec R. Tedd Sias, MSN, FNP-C Internal medicine

## 2021-08-03 NOTE — Telephone Encounter (Signed)
-----   Message from Sallyanne Kuster, NP sent at 08/03/2021 12:50 PM EDT ----- Regarding: lab Please call patient and let her know that I ordered a vitamin D level. She continues to take the weekly vitamin D supplement but we have not checked her vitamin D level since 2020.  I would like her to have it drawn this week but no later than the end of next week.  Thank you Alyssa

## 2021-08-29 ENCOUNTER — Telehealth: Payer: Self-pay

## 2021-08-29 NOTE — Telephone Encounter (Signed)
Called patient to schedule 08/01/21 3 month follow up. She stated she does not need one. Her back has been doing ok. She will call if she needs to be seen-Toni

## 2021-10-11 ENCOUNTER — Other Ambulatory Visit: Payer: Self-pay | Admitting: Nurse Practitioner

## 2021-10-11 DIAGNOSIS — R300 Dysuria: Secondary | ICD-10-CM

## 2021-10-11 DIAGNOSIS — J4530 Mild persistent asthma, uncomplicated: Secondary | ICD-10-CM

## 2021-10-11 DIAGNOSIS — I10 Essential (primary) hypertension: Secondary | ICD-10-CM

## 2021-10-11 DIAGNOSIS — N951 Menopausal and female climacteric states: Secondary | ICD-10-CM

## 2021-10-11 DIAGNOSIS — Z23 Encounter for immunization: Secondary | ICD-10-CM

## 2021-10-11 DIAGNOSIS — E559 Vitamin D deficiency, unspecified: Secondary | ICD-10-CM

## 2021-10-11 DIAGNOSIS — Z0001 Encounter for general adult medical examination with abnormal findings: Secondary | ICD-10-CM

## 2021-10-17 ENCOUNTER — Encounter: Payer: Self-pay | Admitting: Physician Assistant

## 2021-10-17 ENCOUNTER — Other Ambulatory Visit: Payer: Self-pay | Admitting: Physician Assistant

## 2021-10-17 ENCOUNTER — Other Ambulatory Visit: Payer: Self-pay

## 2021-10-17 ENCOUNTER — Telehealth: Payer: 59 | Admitting: Physician Assistant

## 2021-10-17 DIAGNOSIS — J0100 Acute maxillary sinusitis, unspecified: Secondary | ICD-10-CM

## 2021-10-17 DIAGNOSIS — J4520 Mild intermittent asthma, uncomplicated: Secondary | ICD-10-CM | POA: Diagnosis not present

## 2021-10-17 MED ORDER — AMOXICILLIN-POT CLAVULANATE 875-125 MG PO TABS: 1.0000 | ORAL_TABLET | Freq: Two times a day (BID) | ORAL | 0 refills | Status: DC

## 2021-10-17 MED ORDER — ALBUTEROL SULFATE HFA 108 (90 BASE) MCG/ACT IN AERS: 2.0000 | INHALATION_SPRAY | Freq: Four times a day (QID) | RESPIRATORY_TRACT | 0 refills | Status: DC | PRN

## 2021-10-17 NOTE — Progress Notes (Signed)
[facility] [address] [place], [place] [address]  Internal MEDICINE  Telephone Visit  Patient Name: Victoria Castaneda  [id number]  [id number]  Date of Service: 10/21/2021  I connected with the patient at 3:30 by telephone and verified the patients identity using two identifiers.   I discussed the limitations, risks, security and privacy concerns of performing an evaluation and management service by telephone and the availability of in person appointments. I also discussed with the patient that there may be a patient responsible charge related to the service.  The patient expressed understanding and agrees to proceed.    Chief Complaint  Patient presents with   Sinusitis   Telephone Assessment    Covid test is negative    Telephone Screen    [phone]   Cough    Green mucus     Medication Refill    Proair is no longer available     HPI Pt is here for virtual sick visit -Her covid test was negative last night -Last week on Sunday started having sinus pressure and pressure in ears. Coughing up mucus. -Denies fever, chills, body aches -A little wheezing, but no SOB -Has tried OTC sinus tablets, theraflu, nasal spray. Not really helping  Current Medication: Outpatient Encounter Medications as of 10/17/2021  Medication Sig Note   ADVAIR DISKUS 250-50 MCG/ACT AEPB INHALE 1 PUFF INTO THE LUNGS IN THE MORNING AND AT BEDTIME    albuterol (PROVENTIL) (2.5 MG/3ML) 0.083% nebulizer solution Take 2.5 mg by nebulization every 6 (six) hours as needed for wheezing or shortness of breath.    amoxicillin-clavulanate (AUGMENTIN) 875-125 MG tablet Take 1 tablet by mouth 2 (two) times daily. Take with food.    ergocalciferol (DRISDOL) 1.25 MG (50000 UT) capsule Take 1 capsule (50,000 Units total) by mouth once a week.    fluticasone (FLONASE) 50 MCG/ACT nasal spray Place into both nostrils daily.    furosemide (LASIX) 20 MG tablet Take 1 tablet (20 mg total) by mouth daily.     ipratropium-albuterol (DUONEB) 0.5-2.5 (3) MG/3ML SOLN Take 3 mLs by nebulization every 6 (six) hours as needed.    meloxicam (MOBIC) 15 MG tablet Take 1 tablet (15 mg total) by mouth daily.    tiZANidine (ZANAFLEX) 4 MG tablet Take 1 tablet (4 mg total) by mouth at bedtime as needed for muscle spasms.    [DISCONTINUED] albuterol (VENTOLIN HFA) 108 (90 Base) MCG/ACT inhaler Inhale 2 puffs into the lungs every 6 (six) hours as needed for wheezing or shortness of breath.    [DISCONTINUED] PROAIR HFA 108 (90 Base) MCG/ACT inhaler Inhale 2 puffs into the lungs every 6 (six) hours as needed for wheezing or shortness of breath. 10/17/2021: discontinue manufacture   No facility-administered encounter medications on file as of 10/17/2021.    Surgical History: Past Surgical History:  Procedure Laterality Date   ABDOMINAL HYSTERECTOMY     CHOLECYSTECTOMY     COLONOSCOPY WITH PROPOFOL N/A 12/10/2015   Procedure: COLONOSCOPY WITH PROPOFOL;  Surgeon: Midge Minium, MD;  Location: [facility];  Service: Endoscopy;  Laterality: N/A;    Medical History: Past Medical History:  Diagnosis Date   Arthritis    Asthma    Diverticulitis 06/2017   Headache    Hypersomnia, unspecified    Mild persistent asthma    Mixed hyperlipidemia    Motion sickness    Primary ovarian failure    Uncomplicated asthma    Vitamin D deficiency, unspecified    Wheezing  Family History: Family History  Problem Relation Age of Onset   Diabetes Mother    Breast cancer Paternal Aunt     Social History   Socioeconomic History   Marital status: Single    Spouse name: Not on file   Number of children: Not on file   Years of education: Not on file   Highest education level: Not on file  Occupational History   Not on file  Tobacco Use   Smoking status: Never   Smokeless tobacco: Never  Substance and Sexual Activity   Alcohol use: Yes    Comment: occasionally   Drug use: No   Sexual activity: Not on file   Other Topics Concern   Not on file  Social History Narrative   Not on file   Social Determinants of Health   Financial Resource Strain: Not on file  Food Insecurity: Not on file  Transportation Needs: Not on file  Physical Activity: Not on file  Stress: Not on file  Social Connections: Not on file  Intimate Partner Violence: Not on file      Review of Systems  Constitutional:  Negative for fatigue and fever.  HENT:  Positive for congestion, ear pain, postnasal drip and sinus pressure. Negative for mouth sores.   Respiratory:  Positive for cough and wheezing. Negative for shortness of breath.   Cardiovascular:  Negative for chest pain.  Genitourinary:  Negative for flank pain.  Psychiatric/Behavioral: Negative.     Vital Signs: Ht 5\' 2"  (1.575 m)    Wt (!) 307 lb (139.3 kg)    BMI 56.15 kg/m    Observation/Objective:  Pt is able to carry out conversation   Assessment/Plan: 1. Acute non-recurrent maxillary sinusitis Will start on augmentin BID with food. Advised to continue nasal spray and take mucinex to help with congestion. Also advised to rest and stay well hydrated and may use inhaler as needed. - amoxicillin-clavulanate (AUGMENTIN) 875-125 MG tablet; Take 1 tablet by mouth 2 (two) times daily. Take with food.  Dispense: 20 tablet; Refill: 0  2. Mild intermittent asthma without complication May use albuterol inhaler as needed   General Counseling: ayomide zuleta understanding of the findings of today's phone visit and agrees with plan of treatment. I have discussed any further diagnostic evaluation that may be needed or ordered today. We also reviewed her medications today. she has been encouraged to call the office with any questions or concerns that should arise related to todays visit.    No orders of the defined types were placed in this encounter.   Meds ordered this encounter  Medications   DISCONTD: albuterol (VENTOLIN HFA) 108 (90 Base) MCG/ACT  inhaler    Sig: Inhale 2 puffs into the lungs every 6 (six) hours as needed for wheezing or shortness of breath.    Dispense:  8 g    Refill:  0   amoxicillin-clavulanate (AUGMENTIN) 875-125 MG tablet    Sig: Take 1 tablet by mouth 2 (two) times daily. Take with food.    Dispense:  20 tablet    Refill:  0    Time spent:25 Minutes    Dr Vira Agar Internal medicine

## 2021-10-26 ENCOUNTER — Telehealth: Payer: Self-pay

## 2021-10-26 ENCOUNTER — Other Ambulatory Visit: Payer: Self-pay | Admitting: Physician Assistant

## 2021-10-26 DIAGNOSIS — R051 Acute cough: Secondary | ICD-10-CM

## 2021-10-26 MED ORDER — HYDROCOD POLST-CPM POLST ER 10-8 MG/5ML PO SUER: 5.0000 mL | Freq: Two times a day (BID) | ORAL | 0 refills | Status: DC | PRN

## 2021-10-26 NOTE — Telephone Encounter (Signed)
Spoke to pt and informed her that Tussinex was sent to her pharmacy and that she should not drive while taking it due to it makes you drowsy, to take only as needed

## 2021-11-18 ENCOUNTER — Other Ambulatory Visit: Payer: Self-pay | Admitting: Nurse Practitioner

## 2021-11-18 DIAGNOSIS — N951 Menopausal and female climacteric states: Secondary | ICD-10-CM

## 2021-11-18 DIAGNOSIS — J4530 Mild persistent asthma, uncomplicated: Secondary | ICD-10-CM

## 2021-11-18 DIAGNOSIS — I10 Essential (primary) hypertension: Secondary | ICD-10-CM

## 2021-11-18 DIAGNOSIS — R300 Dysuria: Secondary | ICD-10-CM

## 2021-11-18 DIAGNOSIS — Z23 Encounter for immunization: Secondary | ICD-10-CM

## 2021-11-18 DIAGNOSIS — E559 Vitamin D deficiency, unspecified: Secondary | ICD-10-CM

## 2021-11-18 DIAGNOSIS — Z6841 Body Mass Index (BMI) 40.0 and over, adult: Secondary | ICD-10-CM

## 2021-11-18 DIAGNOSIS — Z0001 Encounter for general adult medical examination with abnormal findings: Secondary | ICD-10-CM

## 2021-11-22 ENCOUNTER — Encounter: Payer: Self-pay | Admitting: Nurse Practitioner

## 2021-11-22 ENCOUNTER — Other Ambulatory Visit: Payer: Self-pay

## 2021-11-22 ENCOUNTER — Ambulatory Visit: Payer: Managed Care, Other (non HMO) | Admitting: Nurse Practitioner

## 2021-11-22 VITALS — BP 140/69 | HR 88 | Temp 98.3°F | Resp 16 | Ht 63.0 in | Wt 309.6 lb

## 2021-11-22 DIAGNOSIS — Z6841 Body Mass Index (BMI) 40.0 and over, adult: Secondary | ICD-10-CM | POA: Diagnosis not present

## 2021-11-22 DIAGNOSIS — J4520 Mild intermittent asthma, uncomplicated: Secondary | ICD-10-CM | POA: Diagnosis not present

## 2021-11-22 DIAGNOSIS — I10 Essential (primary) hypertension: Secondary | ICD-10-CM

## 2021-11-22 DIAGNOSIS — Z23 Encounter for immunization: Secondary | ICD-10-CM

## 2021-11-22 MED ORDER — ZOSTER VAC RECOMB ADJUVANTED 50 MCG/0.5ML IM SUSR: 0.5000 mL | Freq: Once | INTRAMUSCULAR | 0 refills | Status: AC

## 2021-11-22 NOTE — Progress Notes (Unsigned)
[facility] [place], [place] [address]  Internal MEDICINE  Office Visit Note  Patient Name: Victoria Castaneda  [id number]  [id number]  Date of Service: 11/22/2021  Chief Complaint  Patient presents with   Medical Management of Chronic Issues    Weight management   Weight Loss   Follow-up    HPI Victoria Castaneda presents for a follow-up visit for  Met test [id number]    Current Medication: Outpatient Encounter Medications as of 11/22/2021  Medication Sig   ADVAIR DISKUS 250-50 MCG/ACT AEPB INHALE 1 PUFF INTO THE LUNGS IN THE MORNING AND AT BEDTIME   albuterol (PROVENTIL) (2.5 MG/3ML) 0.083% nebulizer solution Take 2.5 mg by nebulization every 6 (six) hours as needed for wheezing or shortness of breath.   albuterol (VENTOLIN HFA) 108 (90 Base) MCG/ACT inhaler INHALE 2 PUFFS INTO THE LUNGS EVERY 6 HOURS AS NEEDED FOR WHEEZING OR SHORTNESS OF BREATH   amoxicillin-clavulanate (AUGMENTIN) 875-125 MG tablet Take 1 tablet by mouth 2 (two) times daily. Take with food.   chlorpheniramine-HYDROcodone (TUSSIONEX PENNKINETIC ER) 10-8 MG/5ML SUER Take 5 mLs by mouth every 12 (twelve) hours as needed for cough.   ergocalciferol (DRISDOL) 1.25 MG (50000 UT) capsule Take 1 capsule (50,000 Units total) by mouth once a week.   fluticasone (FLONASE) 50 MCG/ACT nasal spray Place into both nostrils daily.   furosemide (LASIX) 20 MG tablet Take 1 tablet by mouth once daily   ipratropium-albuterol (DUONEB) 0.5-2.5 (3) MG/3ML SOLN Take 3 mLs by nebulization every 6 (six) hours as needed.   meloxicam (MOBIC) 15 MG tablet Take 1 tablet (15 mg total) by mouth daily.   tiZANidine (ZANAFLEX) 4 MG tablet Take 1 tablet (4 mg total) by mouth at bedtime as needed for muscle spasms.   [DISCONTINUED] Zoster Vaccine Adjuvanted [facility]) injection Inject 0.5 mLs into the muscle once.   Zoster Vaccine Adjuvanted [facility]) injection Inject 0.5 mLs into the muscle once for 1 dose.   No  facility-administered encounter medications on file as of 11/22/2021.    Surgical History: Past Surgical History:  Procedure Laterality Date   ABDOMINAL HYSTERECTOMY     CHOLECYSTECTOMY     COLONOSCOPY WITH PROPOFOL N/A 12/10/2015   Procedure: COLONOSCOPY WITH PROPOFOL;  Surgeon: Lucilla Lame, MD;  Location: [place];  Service: Endoscopy;  Laterality: N/A;    Medical History: Past Medical History:  Diagnosis Date   Arthritis    Asthma    Diverticulitis 06/2017   Headache    Hypersomnia, unspecified    Mild persistent asthma    Mixed hyperlipidemia    Motion sickness    Primary ovarian failure    Uncomplicated asthma    Vitamin D deficiency, unspecified    Wheezing     Family History: Family History  Problem Relation Age of Onset   Diabetes Mother    Breast cancer Paternal Aunt     Social History   Socioeconomic History   Marital status: Single    Spouse name: Not on file   Number of children: Not on file   Years of education: Not on file   Highest education level: Not on file  Occupational History   Not on file  Tobacco Use   Smoking status: Never   Smokeless tobacco: Never  Substance and Sexual Activity   Alcohol use: Yes    Comment: occasionally   Drug use: No   Sexual activity: Not on file  Other Topics Concern   Not on file  Social History  Narrative   Not on file   Social Determinants of Health   Financial Resource Strain: Not on file  Food Insecurity: Not on file  Transportation Needs: Not on file  Physical Activity: Not on file  Stress: Not on file  Social Connections: Not on file  Intimate Partner Violence: Not on file      Review of Systems  Vital Signs: BP (!) 142/69    Pulse 88    Temp 98.3 F (36.8 C)    Resp 16    Ht _0  (1.6 m)    Wt (!) 309 lb 9.6 oz (140.4 kg)    SpO2 98%    BMI 54.84 kg/m    Physical Exam     Assessment/Plan:   General Counseling: Victoria Castaneda verbalizes  understanding of the findings of todays visit and agrees with plan of treatment. I have discussed any further diagnostic evaluation that may be needed or ordered today. We also reviewed her medications today. she has been encouraged to call the office with any questions or concerns that should arise related to todays visit.    No orders of the defined types were placed in this encounter.   Meds ordered this encounter  Medications   Zoster Vaccine Adjuvanted [facility]) injection    Sig: Inject 0.5 mLs into the muscle once for 1 dose.    Dispense:  0.5 mL    Refill:  0    Return in about 1 month (around 12/20/2021) for F/U, Weight loss, [name] PCP.   Total time spent:*** Minutes Time spent includes review of chart, medications, test results, and follow up plan with the patient.   [place] Controlled Substance Database was reviewed by me.  This patient was seen by Jonetta Osgood, FNP-C in collaboration with Dr. Clayborn Bigness as a part of collaborative care agreement.   Jaaziel Peatross R. Valetta Fuller, MSN, FNP-C Internal medicine

## 2021-11-24 ENCOUNTER — Encounter: Payer: Self-pay | Admitting: Nurse Practitioner

## 2021-12-03 LAB — COMPREHENSIVE METABOLIC PANEL
ALT: 8 IU/L (ref 0–32)
AST: 10 IU/L (ref 0–40)
Albumin/Globulin Ratio: 1 — ABNORMAL LOW (ref 1.2–2.2)
Albumin: 3.8 g/dL (ref 3.8–4.9)
Alkaline Phosphatase: 69 IU/L (ref 44–121)
BUN/Creatinine Ratio: 17 (ref 9–23)
BUN: 11 mg/dL (ref 6–24)
Bilirubin Total: 0.3 mg/dL (ref 0.0–1.2)
CO2: 26 mmol/L (ref 20–29)
Calcium: 9.4 mg/dL (ref 8.7–10.2)
Chloride: 100 mmol/L (ref 96–106)
Creatinine, Ser: 0.63 mg/dL (ref 0.57–1.00)
Globulin, Total: 3.7 g/dL (ref 1.5–4.5)
Glucose: 113 mg/dL — ABNORMAL HIGH (ref 70–99)
Potassium: 4.2 mmol/L (ref 3.5–5.2)
Sodium: 141 mmol/L (ref 134–144)
Total Protein: 7.5 g/dL (ref 6.0–8.5)
eGFR: 104 mL/min/{1.73_m2} (ref >59)

## 2021-12-03 LAB — CBC WITH DIFFERENTIAL/PLATELET
Basophils Absolute: 0.1 10*3/uL (ref 0.0–0.2)
Basos: 1 %
EOS (ABSOLUTE): 0.4 10*3/uL (ref 0.0–0.4)
Eos: 4 %
Hematocrit: 41.5 % (ref 34.0–46.6)
Hemoglobin: 13.5 g/dL (ref 11.1–15.9)
Immature Grans (Abs): 0 10*3/uL (ref 0.0–0.1)
Immature Granulocytes: 0 %
Lymphocytes Absolute: 2.1 10*3/uL (ref 0.7–3.1)
Lymphs: 23 %
MCH: 27.8 pg (ref 26.6–33.0)
MCHC: 32.5 g/dL (ref 31.5–35.7)
MCV: 86 fL (ref 79–97)
Monocytes Absolute: 0.6 10*3/uL (ref 0.1–0.9)
Monocytes: 6 %
Neutrophils Absolute: 6.2 10*3/uL (ref 1.4–7.0)
Neutrophils: 66 %
Platelets: 410 10*3/uL (ref 150–450)
RBC: 4.85 x10E6/uL (ref 3.77–5.28)
RDW: 13.6 % (ref 11.7–15.4)
WBC: 9.4 10*3/uL (ref 3.4–10.8)

## 2021-12-03 LAB — LIPID PANEL WITH LDL/HDL RATIO
Cholesterol, Total: 222 mg/dL — ABNORMAL HIGH (ref 100–199)
HDL: 73 mg/dL (ref >39)
LDL Chol Calc (NIH): 138 mg/dL — ABNORMAL HIGH (ref 0–99)
LDL/HDL Ratio: 1.9 ratio (ref 0.0–3.2)
Triglycerides: 62 mg/dL (ref 0–149)
VLDL Cholesterol Cal: 11 mg/dL (ref 5–40)

## 2021-12-03 LAB — T4, FREE: Free T4: 1.28 ng/dL (ref 0.82–1.77)

## 2021-12-03 LAB — TSH: TSH: 1.96 u[IU]/mL (ref 0.450–4.500)

## 2021-12-03 LAB — VITAMIN D 25 HYDROXY (VIT D DEFICIENCY, FRACTURES): Vit D, 25-Hydroxy: 19.1 ng/mL — ABNORMAL LOW (ref 30.0–100.0)

## 2021-12-19 ENCOUNTER — Encounter: Payer: Self-pay | Admitting: Nurse Practitioner

## 2021-12-19 ENCOUNTER — Ambulatory Visit (INDEPENDENT_AMBULATORY_CARE_PROVIDER_SITE_OTHER): Payer: Managed Care, Other (non HMO) | Admitting: Nurse Practitioner

## 2021-12-19 VITALS — BP 140/74 | HR 88 | Temp 98.7°F | Resp 16 | Ht 62.5 in | Wt 312.0 lb

## 2021-12-19 DIAGNOSIS — J4530 Mild persistent asthma, uncomplicated: Secondary | ICD-10-CM

## 2021-12-19 DIAGNOSIS — E782 Mixed hyperlipidemia: Secondary | ICD-10-CM | POA: Diagnosis not present

## 2021-12-19 DIAGNOSIS — Z6841 Body Mass Index (BMI) 40.0 and over, adult: Secondary | ICD-10-CM

## 2021-12-19 DIAGNOSIS — E559 Vitamin D deficiency, unspecified: Secondary | ICD-10-CM

## 2021-12-19 DIAGNOSIS — I10 Essential (primary) hypertension: Secondary | ICD-10-CM

## 2021-12-19 MED ORDER — ERGOCALCIFEROL 1.25 MG (50000 UT) PO CAPS: 50000.0000 [IU] | ORAL_CAPSULE | ORAL | 5 refills | Status: DC

## 2021-12-19 MED ORDER — WEGOVY 0.5 MG/0.5ML ~~LOC~~ SOAJ: 0.5000 mg | SUBCUTANEOUS | 1 refills | Status: DC

## 2021-12-19 MED ORDER — ALBUTEROL SULFATE HFA 108 (90 BASE) MCG/ACT IN AERS: 2.0000 | INHALATION_SPRAY | Freq: Four times a day (QID) | RESPIRATORY_TRACT | 0 refills | Status: DC | PRN

## 2021-12-19 MED ORDER — FLUTICASONE-SALMETEROL 250-50 MCG/ACT IN AEPB: 1.0000 | INHALATION_SPRAY | Freq: Two times a day (BID) | RESPIRATORY_TRACT | 1 refills | Status: DC

## 2021-12-19 NOTE — Progress Notes (Cosign Needed)
[facility] [address] [place], [place] [address]  Internal MEDICINE  Office Visit Note  Patient Name: Victoria Castaneda  [id number]  [id number]  Date of Service: 12/19/2021  Chief Complaint  Patient presents with   Follow-up   Hyperlipidemia   Asthma   Weight Loss    HPI Victoria Castaneda presents for a follow-up visit for weight loss management and hyperlipidemia.  Patient was provided with recommended daily caloric intake after her metabolic test at her previous office visit and was provided with sample diet plans.  She has gained 3 pounds since her previous office visit and reports that her diet has been up and down.  She is interested in trying medication to help with weight loss except for phentermine.  She was on phentermine in the past and it made her feel jittery and gave her heart palpitations and she did not like the way it made her feel.  She is interested in trying [facility].    Current Medication: Outpatient Encounter Medications as of 12/19/2021  Medication Sig   albuterol (PROVENTIL) (2.5 MG/3ML) 0.083% nebulizer solution Take 2.5 mg by nebulization every 6 (six) hours as needed for wheezing or shortness of breath.   fluticasone (FLONASE) 50 MCG/ACT nasal spray Place into both nostrils daily.   furosemide (LASIX) 20 MG tablet Take 1 tablet by mouth once daily   ipratropium-albuterol (DUONEB) 0.5-2.5 (3) MG/3ML SOLN Take 3 mLs by nebulization every 6 (six) hours as needed.   meloxicam (MOBIC) 15 MG tablet Take 1 tablet (15 mg total) by mouth daily.   Semaglutide-Weight Management (WEGOVY) 0.5 MG/0.5ML SOAJ Inject 0.5 mg into the skin once a week.   tiZANidine (ZANAFLEX) 4 MG tablet Take 1 tablet (4 mg total) by mouth at bedtime as needed for muscle spasms.   [DISCONTINUED] ADVAIR DISKUS 250-50 MCG/ACT AEPB INHALE 1 PUFF INTO THE LUNGS IN THE MORNING AND AT BEDTIME   [DISCONTINUED] albuterol (VENTOLIN HFA) 108 (90 Base) MCG/ACT inhaler INHALE 2 PUFFS INTO THE LUNGS EVERY 6  HOURS AS NEEDED FOR WHEEZING OR SHORTNESS OF BREATH   [DISCONTINUED] ergocalciferol (DRISDOL) 1.25 MG (50000 UT) capsule Take 1 capsule (50,000 Units total) by mouth once a week.   albuterol (VENTOLIN HFA) 108 (90 Base) MCG/ACT inhaler Inhale 2 puffs into the lungs every 6 (six) hours as needed for wheezing or shortness of breath.   ergocalciferol (DRISDOL) 1.25 MG (50000 UT) capsule Take 1 capsule (50,000 Units total) by mouth once a week.   fluticasone-salmeterol (ADVAIR DISKUS) 250-50 MCG/ACT AEPB Inhale 1 puff into the lungs 2 (two) times daily. in the morning and at bedtime.   [DISCONTINUED] amoxicillin-clavulanate (AUGMENTIN) 875-125 MG tablet Take 1 tablet by mouth 2 (two) times daily. Take with food.   [DISCONTINUED] chlorpheniramine-HYDROcodone (TUSSIONEX PENNKINETIC ER) 10-8 MG/5ML SUER Take 5 mLs by mouth every 12 (twelve) hours as needed for cough.   No facility-administered encounter medications on file as of 12/19/2021.    Surgical History: Past Surgical History:  Procedure Laterality Date   ABDOMINAL HYSTERECTOMY     CHOLECYSTECTOMY     COLONOSCOPY WITH PROPOFOL N/A 12/10/2015   Procedure: COLONOSCOPY WITH PROPOFOL;  Surgeon: Midge Minium, MD;  Location: [facility];  Service: Endoscopy;  Laterality: N/A;    Medical History: Past Medical History:  Diagnosis Date   Arthritis    Asthma    Diverticulitis 06/2017   Headache    Hypersomnia, unspecified    Mild persistent asthma    Mixed hyperlipidemia    Motion  sickness    Primary ovarian failure    Uncomplicated asthma    Vitamin D deficiency, unspecified    Wheezing     Family History: Family History  Problem Relation Age of Onset   Diabetes Mother    Breast cancer Paternal Aunt     Social History   Socioeconomic History   Marital status: Single    Spouse name: Not on file   Number of children: Not on file   Years of education: Not on file   Highest education level: Not on file  Occupational History    Not on file  Tobacco Use   Smoking status: Never   Smokeless tobacco: Never  Substance and Sexual Activity   Alcohol use: Yes    Comment: occasionally   Drug use: No   Sexual activity: Not on file  Other Topics Concern   Not on file  Social History Narrative   Not on file   Social Determinants of Health   Financial Resource Strain: Not on file  Food Insecurity: Not on file  Transportation Needs: Not on file  Physical Activity: Not on file  Stress: Not on file  Social Connections: Not on file  Intimate Partner Violence: Not on file      Review of Systems  Constitutional:  Negative for chills, fatigue and unexpected weight change.  HENT: Negative.  Negative for congestion, rhinorrhea, sneezing and sore throat.   Eyes:  Negative for redness.  Respiratory: Negative.  Negative for cough, chest tightness, shortness of breath and wheezing.   Cardiovascular: Negative.  Negative for chest pain and palpitations.  Gastrointestinal:  Negative for abdominal pain, constipation, diarrhea, nausea and vomiting.  Genitourinary: Negative.  Negative for dysuria and frequency.  Musculoskeletal:  Negative for arthralgias, back pain, joint swelling and neck pain.  Skin:  Negative for rash.  Neurological: Negative.  Negative for tremors and numbness.  Hematological:  Negative for adenopathy. Does not bruise/bleed easily.  Psychiatric/Behavioral:  Negative for behavioral problems (Depression), sleep disturbance and suicidal ideas. The patient is not nervous/anxious.    Vital Signs: BP 140/74    Pulse 88    Temp 98.7 F (37.1 C)    Resp 16    Ht 5' 2.5" (1.588 m)    Wt (!) 312 lb (141.5 kg)    SpO2 97%    BMI 56.16 kg/m    Physical Exam Vitals reviewed.  Constitutional:      General: She is not in acute distress.    Appearance: Normal appearance. She is obese. She is not ill-appearing.  HENT:     Head: Normocephalic and atraumatic.  Eyes:     Pupils: Pupils are equal, round, and  reactive to light.  Cardiovascular:     Rate and Rhythm: Normal rate and regular rhythm.  Pulmonary:     Effort: Pulmonary effort is normal. No respiratory distress.  Neurological:     Mental Status: She is alert and oriented to person, place, and time.  Psychiatric:        Mood and Affect: Mood normal.        Behavior: Behavior normal.       Assessment/Plan: 1. Mild persistent asthma, uncomplicated Stable, refills ordered.  - fluticasone-salmeterol (ADVAIR DISKUS) 250-50 MCG/ACT AEPB; Inhale 1 puff into the lungs 2 (two) times daily. in the morning and at bedtime.  Dispense: 180 each; Refill: 1 - albuterol (VENTOLIN HFA) 108 (90 Base) MCG/ACT inhaler; Inhale 2 puffs into the lungs every 6 (six) hours as  needed for wheezing or shortness of breath.  Dispense: 20.1 g; Refill: 0  2. Vitamin D deficiency Restart prescription strength vitamin D. Current level was 19.1 on her labs.  - ergocalciferol (DRISDOL) 1.25 MG (50000 UT) capsule; Take 1 capsule (50,000 Units total) by mouth once a week.  Dispense: 4 capsule; Refill: 5  3. Essential hypertension Stable with current medications.   4. Mixed hyperlipidemia Total cholesterol is 222 and LDL is 138. Increase lean proteins in diet, decrease red meat in diet. Weight loss will be very important in improving her cholesterol levels.   5. Morbid obesity with BMI of 50.0-59.9, adult (HCC) 4 week ample of 0.25 mg dose provided to patient. 0.5 mg dose sent to pharmacy. Patient will increase to 0.5 mg dose on week 5.  - Semaglutide-Weight Management (WEGOVY) 0.5 MG/0.5ML SOAJ; Inject 0.5 mg into the skin once a week.  Dispense: 2 mL; Refill: 1   General Counseling: Victoria HatchetSheila verbalizes understanding of the findings of todays visit and agrees with plan of treatment. I have discussed any further diagnostic evaluation that may be needed or ordered today. We also reviewed her medications today. she has been encouraged to call the office with any  questions or concerns that should arise related to todays visit.    No orders of the defined types were placed in this encounter.   Meds ordered this encounter  Medications   ergocalciferol (DRISDOL) 1.25 MG (50000 UT) capsule    Sig: Take 1 capsule (50,000 Units total) by mouth once a week.    Dispense:  4 capsule    Refill:  5   fluticasone-salmeterol (ADVAIR DISKUS) 250-50 MCG/ACT AEPB    Sig: Inhale 1 puff into the lungs 2 (two) times daily. in the morning and at bedtime.    Dispense:  180 each    Refill:  1   albuterol (VENTOLIN HFA) 108 (90 Base) MCG/ACT inhaler    Sig: Inhale 2 puffs into the lungs every 6 (six) hours as needed for wheezing or shortness of breath.    Dispense:  20.1 g    Refill:  0    **Patient requests 90 days supply**   Semaglutide-Weight Management (WEGOVY) 0.5 MG/0.5ML SOAJ    Sig: Inject 0.5 mg into the skin once a week.    Dispense:  2 mL    Refill:  1    Dx code E66.01    Return in 1 month (on 01/19/2022) for F/U, Weight loss, Nahum Sherrer PCP.   Total time spent:30 Minutes Time spent includes review of chart, medications, test results, and follow up plan with the patient.   [place] Controlled Substance Database was reviewed by me.  This patient was seen by Sallyanne KusterAlyssa Moussa Wiegand, FNP-C in collaboration with Dr. Beverely RisenFozia Khan as a part of collaborative care agreement.   Dahlton Hinde R. Tedd SiasAbernathy, MSN, FNP-C Internal medicine

## 2021-12-25 ENCOUNTER — Encounter: Payer: Self-pay | Admitting: Nurse Practitioner

## 2021-12-25 ENCOUNTER — Telehealth: Payer: Self-pay

## 2021-12-25 NOTE — Telephone Encounter (Signed)
PA for WEGOVY 0.5 mg/0.5 ml was approved 12/25/21 and valid to 07/27/22.  [id number] ?

## 2021-12-25 NOTE — Telephone Encounter (Signed)
PA for WEGOVY 0.5 mg/0.50ml sent 12/25/21 @ 517 pm ?

## 2022-01-05 ENCOUNTER — Ambulatory Visit: Payer: Managed Care, Other (non HMO) | Admitting: Physician Assistant

## 2022-01-05 ENCOUNTER — Encounter: Payer: Self-pay | Admitting: Physician Assistant

## 2022-01-05 VITALS — BP 170/103 | HR 91 | Temp 97.6°F | Resp 16 | Ht 62.5 in | Wt 309.4 lb

## 2022-01-05 DIAGNOSIS — R300 Dysuria: Secondary | ICD-10-CM

## 2022-01-05 DIAGNOSIS — M545 Low back pain, unspecified: Secondary | ICD-10-CM | POA: Diagnosis not present

## 2022-01-05 DIAGNOSIS — I10 Essential (primary) hypertension: Secondary | ICD-10-CM

## 2022-01-05 LAB — POCT URINALYSIS DIPSTICK
Bilirubin, UA: NEGATIVE
Blood, UA: NEGATIVE
Glucose, UA: NEGATIVE
Leukocytes, UA: NEGATIVE
Nitrite, UA: NEGATIVE
Protein, UA: NEGATIVE
Spec Grav, UA: 1.015 (ref 1.010–1.025)
Urobilinogen, UA: 0.2 E.U./dL
pH, UA: 5 (ref 5.0–8.0)

## 2022-01-05 MED ORDER — NITROFURANTOIN MONOHYD MACRO 100 MG PO CAPS: ORAL_CAPSULE | ORAL | 0 refills | Status: DC

## 2022-01-05 NOTE — Progress Notes (Signed)
[facility] ?[address] ?[place], [place] [address] ? ?Internal MEDICINE  ?Office Visit Note ? ?Patient Name: Victoria Castaneda ? [id number]  ?[id number] ? ?Date of Service: 01/10/2022 ? ?Chief Complaint  ?Patient presents with  ? Back Pain  ?  Possible UTI, ongoing for a week, mostly lower right part of back  ? ? ?HPI ?Patient is here for sick visit ?-She has been experiencing some urinary urgency, frequency, and back pain x 1 week. No odor or color change. ?-No falls, injuries. ?-Tried meloxicam but didn't help. ?-will try zanaflex--has some at home to help with low back pain.  She understands that back pain may be due to to UTI but does have reproducible low back pain that may be musculoskeletal in nature. ?-In pain currently which explains elevated BP, but will need to monitor closely. ? ?Current Medication: ?Outpatient Encounter Medications as of 01/05/2022  ?Medication Sig  ? albuterol (PROVENTIL) (2.5 MG/3ML) 0.083% nebulizer solution Take 2.5 mg by nebulization every 6 (six) hours as needed for wheezing or shortness of breath.  ? albuterol (VENTOLIN HFA) 108 (90 Base) MCG/ACT inhaler Inhale 2 puffs into the lungs every 6 (six) hours as needed for wheezing or shortness of breath.  ? ergocalciferol (DRISDOL) 1.25 MG (50000 UT) capsule Take 1 capsule (50,000 Units total) by mouth once a week.  ? fluticasone (FLONASE) 50 MCG/ACT nasal spray Place into both nostrils daily.  ? fluticasone-salmeterol (ADVAIR DISKUS) 250-50 MCG/ACT AEPB Inhale 1 puff into the lungs 2 (two) times daily. in the morning and at bedtime.  ? furosemide (LASIX) 20 MG tablet Take 1 tablet by mouth once daily  ? ipratropium-albuterol (DUONEB) 0.5-2.5 (3) MG/3ML SOLN Take 3 mLs by nebulization every 6 (six) hours as needed.  ? meloxicam (MOBIC) 15 MG tablet Take 1 tablet (15 mg total) by mouth daily.  ? nitrofurantoin, macrocrystal-monohydrate, (MACROBID) 100 MG capsule Take 1 cap twice per day for 10 days.  ? Semaglutide-Weight Management  (WEGOVY) 0.5 MG/0.5ML SOAJ Inject 0.5 mg into the skin once a week.  ? tiZANidine (ZANAFLEX) 4 MG tablet Take 1 tablet (4 mg total) by mouth at bedtime as needed for muscle spasms.  ? ?No facility-administered encounter medications on file as of 01/05/2022.  ? ? ?Surgical History: ?Past Surgical History:  ?Procedure Laterality Date  ? ABDOMINAL HYSTERECTOMY    ? CHOLECYSTECTOMY    ? COLONOSCOPY WITH PROPOFOL N/A 12/10/2015  ? Procedure: COLONOSCOPY WITH PROPOFOL;  Surgeon: Midge Minium, MD;  Location: [facility];  Service: Endoscopy;  Laterality: N/A;  ? ? ?Medical History: ?Past Medical History:  ?Diagnosis Date  ? Arthritis   ? Asthma   ? Diverticulitis 06/2017  ? Headache   ? Hypersomnia, unspecified   ? Mild persistent asthma   ? Mixed hyperlipidemia   ? Motion sickness   ? Primary ovarian failure   ? Uncomplicated asthma   ? Vitamin D deficiency, unspecified   ? Wheezing   ? ? ?Family History: ?Family History  ?Problem Relation Age of Onset  ? Diabetes Mother   ? Breast cancer Paternal Aunt   ? ? ?Social History  ? ?Socioeconomic History  ? Marital status: Single  ?  Spouse name: Not on file  ? Number of children: Not on file  ? Years of education: Not on file  ? Highest education level: Not on file  ?Occupational History  ? Not on file  ?Tobacco Use  ? Smoking status: Never  ? Smokeless tobacco: Never  ?Substance  and Sexual Activity  ? Alcohol use: Yes  ?  Comment: occasionally  ? Drug use: No  ? Sexual activity: Not on file  ?Other Topics Concern  ? Not on file  ?Social History Narrative  ? Not on file  ? ?Social Determinants of Health  ? ?Financial Resource Strain: Not on file  ?Food Insecurity: Not on file  ?Transportation Needs: Not on file  ?Physical Activity: Not on file  ?Stress: Not on file  ?Social Connections: Not on file  ?Intimate Partner Violence: Not on file  ? ? ? ? ?Review of Systems  ?Constitutional:  Negative for fatigue and fever.  ?HENT:  Negative for congestion, mouth sores and  postnasal drip.   ?Respiratory:  Negative for cough.   ?Cardiovascular:  Negative for chest pain.  ?Genitourinary:  Positive for dysuria, frequency and urgency. Negative for flank pain.  ?Musculoskeletal:  Positive for back pain.  ?Psychiatric/Behavioral: Negative.    ? ?Vital Signs: ?BP (!) 170/103   Pulse 91   Temp 97.6 ?F (36.4 ?C)   Resp 16   Ht 5' 2.5" (1.588 m)   Wt (!) 309 lb 6.4 oz (140.3 kg)   SpO2 97%   BMI 55.69 kg/m?  ? ? ?Physical Exam ?Vitals reviewed.  ?Constitutional:   ?   General: She is not in acute distress. ?   Appearance: Normal appearance. She is obese. She is not ill-appearing.  ?HENT:  ?   Head: Normocephalic and atraumatic.  ?Eyes:  ?   Pupils: Pupils are equal, round, and reactive to light.  ?Cardiovascular:  ?   Rate and Rhythm: Normal rate and regular rhythm.  ?Pulmonary:  ?   Effort: Pulmonary effort is normal. No respiratory distress.  ?Abdominal:  ?   Tenderness: There is no abdominal tenderness. There is no right CVA tenderness or left CVA tenderness.  ?Musculoskeletal:     ?   General: Tenderness present.  ?Skin: ?   General: Skin is warm and dry.  ?Neurological:  ?   Mental Status: She is alert and oriented to person, place, and time.  ?Psychiatric:     ?   Mood and Affect: Mood normal.     ?   Behavior: Behavior normal.  ? ? ? ? ? ?Assessment/Plan: ?1. Dysuria ?Dip shows signs of infection. Will start on macrobid and adjust based on C/S ?- POCT Urinalysis Dipstick ?- nitrofurantoin, macrocrystal-monohydrate, (MACROBID) 100 MG capsule; Take 1 cap twice per day for 10 days.  Dispense: 20 capsule; Refill: 0 ?- CULTURE, URINE COMPREHENSIVE ? ?2. Essential hypertension ?Elevated in office likely due to pain and will need to monitor closely.  Normally well controlled. Has follow up appt in 2-3 weeks and BP will be rechecked. Should contact office if continued elevation or any symptoms arise prior to next visit. ? ?3. Acute midline low back pain without sciatica ?May be due to  current UTI and may improve with antibiotics.  Patient may continue to take Mobic or use Zanaflex as needed for continued low back pain as well as try heat or ice ? ? ?General Counseling: Victoria Castaneda verbalizes understanding of the findings of todays visit and agrees with plan of treatment. I have discussed any further diagnostic evaluation that may be needed or ordered today. We also reviewed her medications today. she has been encouraged to call the office with any questions or concerns that should arise related to todays visit. ? ? ? ?Orders Placed This Encounter  ?Procedures  ? CULTURE, URINE  COMPREHENSIVE  ? POCT Urinalysis Dipstick  ? ? ?Meds ordered this encounter  ?Medications  ? nitrofurantoin, macrocrystal-monohydrate, (MACROBID) 100 MG capsule  ?  Sig: Take 1 cap twice per day for 10 days.  ?  Dispense:  20 capsule  ?  Refill:  0  ? ? ?This patient was seen by Lynn Ito, PA-C in collaboration with Dr. Beverely Risen as a part of collaborative care agreement. ? ? ?Total time spent:30 Minutes ?Time spent includes review of chart, medications, test results, and follow up plan with the patient.  ? ? ? ? ?Dr Lyndon Code ?Internal medicine  ?

## 2022-01-11 LAB — CULTURE, URINE COMPREHENSIVE

## 2022-01-23 ENCOUNTER — Encounter: Payer: Self-pay | Admitting: Nurse Practitioner

## 2022-01-23 ENCOUNTER — Ambulatory Visit (INDEPENDENT_AMBULATORY_CARE_PROVIDER_SITE_OTHER): Payer: Managed Care, Other (non HMO) | Admitting: Nurse Practitioner

## 2022-01-23 VITALS — BP 138/80 | HR 95 | Temp 98.6°F | Resp 16 | Ht 62.0 in | Wt 308.0 lb

## 2022-01-23 DIAGNOSIS — J4530 Mild persistent asthma, uncomplicated: Secondary | ICD-10-CM | POA: Diagnosis not present

## 2022-01-23 DIAGNOSIS — Z6841 Body Mass Index (BMI) 40.0 and over, adult: Secondary | ICD-10-CM | POA: Diagnosis not present

## 2022-01-23 DIAGNOSIS — I10 Essential (primary) hypertension: Secondary | ICD-10-CM | POA: Diagnosis not present

## 2022-01-23 MED ORDER — WEGOVY 0.5 MG/0.5ML ~~LOC~~ SOAJ: 0.5000 mg | SUBCUTANEOUS | 2 refills | Status: DC

## 2022-01-23 NOTE — Progress Notes (Signed)
[facility] ?[address] ?[place], [place] [address] ? ?Internal MEDICINE  ?Office Visit Note ? ?Patient Name: Victoria SartoriusSheila Castaneda ? [id number]  ?[id number] ? ?Date of Service: 01/23/2022 ? ?Chief Complaint  ?Patient presents with  ? Follow-up  ? Weight Loss  ? Hyperlipidemia  ? Asthma  ? ? ?HPI ?Victoria HatchetSheila presents for follow-up visit for weight loss management, hypertension and hyperlipidemia.  Her weight at home was 304 pounds on her home scale.  Her weight today at the office visit is 308 pounds she has lost between 4 to 8 pounds since her previous office visit.  She is doing well on the current dose of Wegovy she reports that she does feel like her appetite has decreased and that sometimes she gets full before finishing her meal.  She denies any significant side effects related to the medication.  ?Her blood pressure is stable on current medications.  ?Her mild intermittent asthma is currently stable.  ? ? ?Current Medication: ?Outpatient Encounter Medications as of 01/23/2022  ?Medication Sig  ? albuterol (PROVENTIL) (2.5 MG/3ML) 0.083% nebulizer solution Take 2.5 mg by nebulization every 6 (six) hours as needed for wheezing or shortness of breath.  ? albuterol (VENTOLIN HFA) 108 (90 Base) MCG/ACT inhaler Inhale 2 puffs into the lungs every 6 (six) hours as needed for wheezing or shortness of breath.  ? ergocalciferol (DRISDOL) 1.25 MG (50000 UT) capsule Take 1 capsule (50,000 Units total) by mouth once a week.  ? fluticasone (FLONASE) 50 MCG/ACT nasal spray Place into both nostrils daily.  ? fluticasone-salmeterol (ADVAIR DISKUS) 250-50 MCG/ACT AEPB Inhale 1 puff into the lungs 2 (two) times daily. in the morning and at bedtime.  ? furosemide (LASIX) 20 MG tablet Take 1 tablet by mouth once daily  ? ipratropium-albuterol (DUONEB) 0.5-2.5 (3) MG/3ML SOLN Take 3 mLs by nebulization every 6 (six) hours as needed.  ? meloxicam (MOBIC) 15 MG tablet Take 1 tablet (15 mg total) by mouth daily.  ? tiZANidine (ZANAFLEX)  4 MG tablet Take 1 tablet (4 mg total) by mouth at bedtime as needed for muscle spasms.  ? [DISCONTINUED] Semaglutide-Weight Management (WEGOVY) 0.5 MG/0.5ML SOAJ Inject 0.5 mg into the skin once a week.  ? Semaglutide-Weight Management (WEGOVY) 0.5 MG/0.5ML SOAJ Inject 0.5 mg into the skin once a week.  ? [DISCONTINUED] nitrofurantoin, macrocrystal-monohydrate, (MACROBID) 100 MG capsule Take 1 cap twice per day for 10 days. (Patient not taking: Reported on 01/23/2022)  ? [DISCONTINUED] Semaglutide-Weight Management (WEGOVY) 0.5 MG/0.5ML SOAJ Inject 0.5 mg into the skin once a week.  ? ?No facility-administered encounter medications on file as of 01/23/2022.  ? ? ?Surgical History: ?Past Surgical History:  ?Procedure Laterality Date  ? ABDOMINAL HYSTERECTOMY    ? CHOLECYSTECTOMY    ? COLONOSCOPY WITH PROPOFOL N/A 12/10/2015  ? Procedure: COLONOSCOPY WITH PROPOFOL;  Surgeon: Midge Miniumarren Wohl, MD;  [facility];  Service: Endoscopy;  Laterality: N/A;  ? ? ?Medical History: ?Past Medical History:  ?Diagnosis Date  ? Arthritis   ? Asthma   ? Diverticulitis 06/2017  ? Headache   ? Hypersomnia, unspecified   ? Mild persistent asthma   ? Mixed hyperlipidemia   ? Motion sickness   ? Primary ovarian failure   ? Uncomplicated asthma   ? Vitamin D deficiency, unspecified   ? Wheezing   ? ? ?Family History: ?Family History  ?Problem Relation Age of Onset  ? Diabetes Mother   ? Breast cancer Paternal Aunt   ? ? ?Social History  ? ?  Socioeconomic History  ? Marital status: Single  ?  Spouse name: Not on file  ? Number of children: Not on file  ? Years of education: Not on file  ? Highest education level: Not on file  ?Occupational History  ? Not on file  ?Tobacco Use  ? Smoking status: Never  ? Smokeless tobacco: Never  ?Substance and Sexual Activity  ? Alcohol use: Yes  ?  Comment: occasionally  ? Drug use: No  ? Sexual activity: Not on file  ?Other Topics Concern  ? Not on file  ?Social History Narrative  ? Not on file   ? ?Social Determinants of Health  ? ?Financial Resource Strain: Not on file  ?Food Insecurity: Not on file  ?Transportation Needs: Not on file  ?Physical Activity: Not on file  ?Stress: Not on file  ?Social Connections: Not on file  ?Intimate Partner Violence: Not on file  ? ? ? ? ?Review of Systems  ?Constitutional:  Negative for chills, fatigue and unexpected weight change.  ?HENT:  Negative for congestion, rhinorrhea, sneezing and sore throat.   ?Respiratory: Negative.  Negative for cough, chest tightness, shortness of breath and wheezing.   ?Cardiovascular: Negative.  Negative for chest pain and palpitations.  ?Gastrointestinal:  Negative for abdominal pain, constipation, diarrhea, nausea and vomiting.  ?Musculoskeletal:  Negative for arthralgias, back pain, joint swelling and neck pain.  ?Neurological: Negative.   ?Psychiatric/Behavioral:  Behavioral problem: Depression.   ? ?Vital Signs: ?BP 138/80   Pulse 95   Temp 98.6 ?F (37 ?C)   Resp 16   Ht 5\' 2"  (1.575 m)   Wt (!) 308 lb (139.7 kg)   SpO2 96%   BMI 56.33 kg/m?  ? ? ?Physical Exam ?Vitals reviewed.  ?Constitutional:   ?   Appearance: Normal appearance.  ?HENT:  ?   Head: Normocephalic and atraumatic.  ?Eyes:  ?   Pupils: Pupils are equal, round, and reactive to light.  ?Cardiovascular:  ?   Rate and Rhythm: Normal rate and regular rhythm.  ?Pulmonary:  ?   Effort: Pulmonary effort is normal. No respiratory distress.  ?Neurological:  ?   Mental Status: She is alert and oriented to person, place, and time.  ?Psychiatric:     ?   Mood and Affect: Mood normal.     ?   Behavior: Behavior normal.  ? ? ? ? ? ?Assessment/Plan: ?1. Essential hypertension ?Stable with current medication ? ?2. Mild persistent asthma, uncomplicated ?Stable, no issues ? ?3. Morbid obesity with BMI of 50.0-59.9, adult (HCC) ?Continue with wegovy, patient is making good progress, will reassess in 4 weeks and determine if the dose needs to be increased.  ?- Semaglutide-Weight  Management (WEGOVY) 0.5 MG/0.5ML SOAJ; Inject 0.5 mg into the skin once a week.  Dispense: 2 mL; Refill: 2 ? ? ?General Counseling: misheel gowans understanding of the findings of todays visit and agrees with plan of treatment. I have discussed any further diagnostic evaluation that may be needed or ordered today. We also reviewed her medications today. she has been encouraged to call the office with any questions or concerns that should arise related to todays visit. ? ? ? ?No orders of the defined types were placed in this encounter. ? ? ?Meds ordered this encounter  ?Medications  ? DISCONTD: Semaglutide-Weight Management (WEGOVY) 0.5 MG/0.5ML SOAJ  ?  Sig: Inject 0.5 mg into the skin once a week.  ?  Dispense:  2 mL  ?  Refill:  2  ?  Dx code E66.01  ? Semaglutide-Weight Management (WEGOVY) 0.5 MG/0.5ML SOAJ  ?  Sig: Inject 0.5 mg into the skin once a week.  ?  Dispense:  2 mL  ?  Refill:  2  ?  Dx code E66.01  ? ? ?Return in about 4 weeks (around 02/20/2022) for F/U, Weight loss, Krystian Younglove PCP. ? ? ?Total time spent:30 Minutes ?Time spent includes review of chart, medications, test results, and follow up plan with the patient.  ? ?[place] Controlled Substance Database was reviewed by me. ? ?This patient was seen by Sallyanne Kuster, FNP-C in collaboration with Dr. Beverely Risen as a part of collaborative care agreement. ? ? ?Guilianna Mckoy R. Tedd Sias, MSN, FNP-C ?Internal medicine  ?

## 2022-02-21 ENCOUNTER — Ambulatory Visit: Payer: Managed Care, Other (non HMO) | Admitting: Nurse Practitioner

## 2022-02-21 ENCOUNTER — Encounter: Payer: Self-pay | Admitting: Nurse Practitioner

## 2022-02-21 VITALS — BP 138/75 | HR 85 | Temp 98.4°F | Resp 16 | Ht 62.0 in | Wt 305.2 lb

## 2022-02-21 DIAGNOSIS — Z6841 Body Mass Index (BMI) 40.0 and over, adult: Secondary | ICD-10-CM

## 2022-02-21 DIAGNOSIS — I10 Essential (primary) hypertension: Secondary | ICD-10-CM | POA: Diagnosis not present

## 2022-02-21 DIAGNOSIS — E782 Mixed hyperlipidemia: Secondary | ICD-10-CM | POA: Diagnosis not present

## 2022-02-21 DIAGNOSIS — R600 Localized edema: Secondary | ICD-10-CM

## 2022-02-21 MED ORDER — FUROSEMIDE 40 MG PO TABS: 40.0000 mg | ORAL_TABLET | Freq: Every day | ORAL | 1 refills | Status: DC

## 2022-02-21 MED ORDER — SEMAGLUTIDE-WEIGHT MANAGEMENT 1 MG/0.5ML ~~LOC~~ SOAJ: 1.0000 mg | SUBCUTANEOUS | 3 refills | Status: DC

## 2022-02-21 NOTE — Progress Notes (Signed)
[facility] [address] [place], [place] [address]  Internal MEDICINE  Office Visit Note  Patient Name: Victoria SartoriusSheila Castaneda  [id number]  [id number]  Date of Service: 02/21/2022  Chief Complaint  Patient presents with   Follow-up   Hyperlipidemia   Asthma   Weight Loss   Cough    Light cough for 2 weeks    HPI Victoria HatchetSheila presents for follow-up visit for weight loss management, she has also had a light cough for 2 weeks weeks which she blames on her environmental allergies.  She is feeling the positive effects of the [facility] weekly injections and she has lost approximately 8 pounds since her previous office visit.  Her weight at home first thing in the morning prior to eating and after her first void was 300 pounds even on a scale.  She is interested in increasing the Wegovy dose to 1 mg weekly and she is also to increase her furosemide dose if possible.   Current Medication: Outpatient Encounter Medications as of 02/21/2022  Medication Sig   albuterol (PROVENTIL) (2.5 MG/3ML) 0.083% nebulizer solution Take 2.5 mg by nebulization every 6 (six) hours as needed for wheezing or shortness of breath.   albuterol (VENTOLIN HFA) 108 (90 Base) MCG/ACT inhaler Inhale 2 puffs into the lungs every 6 (six) hours as needed for wheezing or shortness of breath.   ergocalciferol (DRISDOL) 1.25 MG (50000 UT) capsule Take 1 capsule (50,000 Units total) by mouth once a week.   fluticasone (FLONASE) 50 MCG/ACT nasal spray Place into both nostrils daily.   fluticasone-salmeterol (ADVAIR DISKUS) 250-50 MCG/ACT AEPB Inhale 1 puff into the lungs 2 (two) times daily. in the morning and at bedtime.   furosemide (LASIX) 40 MG tablet Take 1 tablet (40 mg total) by mouth daily.   ipratropium-albuterol (DUONEB) 0.5-2.5 (3) MG/3ML SOLN Take 3 mLs by nebulization every 6 (six) hours as needed.   meloxicam (MOBIC) 15 MG tablet Take 1 tablet (15 mg total) by mouth daily.   tiZANidine (ZANAFLEX) 4 MG tablet Take 1  tablet (4 mg total) by mouth at bedtime as needed for muscle spasms.   [DISCONTINUED] furosemide (LASIX) 20 MG tablet Take 1 tablet by mouth once daily   [DISCONTINUED] Semaglutide-Weight Management (WEGOVY) 0.5 MG/0.5ML SOAJ Inject 0.5 mg into the skin once a week.   [DISCONTINUED] Semaglutide-Weight Management 1 MG/0.5ML SOAJ Inject 1 mg into the skin once a week.   Semaglutide-Weight Management 1 MG/0.5ML SOAJ Inject 1 mg into the skin once a week.   No facility-administered encounter medications on file as of 02/21/2022.    Surgical History: Past Surgical History:  Procedure Laterality Date   ABDOMINAL HYSTERECTOMY     CHOLECYSTECTOMY     COLONOSCOPY WITH PROPOFOL N/A 12/10/2015   Procedure: COLONOSCOPY WITH PROPOFOL;  Surgeon: Midge Miniumarren Wohl, MD;  [facility];  Service: Endoscopy;  Laterality: N/A;    Medical History: Past Medical History:  Diagnosis Date   Arthritis    Asthma    Diverticulitis 06/2017   Headache    Hypersomnia, unspecified    Mild persistent asthma    Mixed hyperlipidemia    Motion sickness    Primary ovarian failure    Uncomplicated asthma    Vitamin D deficiency, unspecified    Wheezing     Family History: Family History  Problem Relation Age of Onset   Diabetes Mother    Breast cancer Paternal Aunt     Social History   Socioeconomic History   Marital  status: Single    Spouse name: Not on file   Number of children: Not on file   Years of education: Not on file   Highest education level: Not on file  Occupational History   Not on file  Tobacco Use   Smoking status: Never   Smokeless tobacco: Never  Substance and Sexual Activity   Alcohol use: Yes    Comment: occasionally   Drug use: No   Sexual activity: Not on file  Other Topics Concern   Not on file  Social History Narrative   Not on file   Social Determinants of Health   Financial Resource Strain: Not on file  Food Insecurity: Not on file  Transportation  Needs: Not on file  Physical Activity: Not on file  Stress: Not on file  Social Connections: Not on file  Intimate Partner Violence: Not on file      Review of Systems  Constitutional:  Negative for chills, fatigue and unexpected weight change.  HENT:  Negative for congestion, rhinorrhea, sneezing and sore throat.   Respiratory:  Positive for cough. Negative for chest tightness, shortness of breath and wheezing.   Cardiovascular: Negative.  Negative for chest pain and palpitations.  Gastrointestinal:  Negative for abdominal pain, constipation, diarrhea, nausea and vomiting.  Musculoskeletal:  Negative for arthralgias, back pain, joint swelling and neck pain.  Neurological: Negative.   Psychiatric/Behavioral:  Behavioral problem: Depression.    Vital Signs: BP 138/75   Pulse 85   Temp 98.4 F (36.9 C)   Resp 16   Ht 5\' 2"  (1.575 m)   Wt (!) 305 lb 3.2 oz (138.4 kg)   SpO2 97%   BMI 55.82 kg/m    Physical Exam Vitals reviewed.  Constitutional:      Appearance: Normal appearance.  HENT:     Head: Normocephalic and atraumatic.  Eyes:     Pupils: Pupils are equal, round, and reactive to light.  Cardiovascular:     Rate and Rhythm: Normal rate and regular rhythm.  Pulmonary:     Effort: Pulmonary effort is normal. No respiratory distress.  Neurological:     Mental Status: She is alert and oriented to person, place, and time.  Psychiatric:        Mood and Affect: Mood normal.        Behavior: Behavior normal.       Assessment/Plan: 1. Essential hypertension Lasix dose increased to 40 mg daily, follow-up in 2 months - furosemide (LASIX) 40 MG tablet; Take 1 tablet (40 mg total) by mouth daily.  Dispense: 90 tablet; Refill: 1  2. Mixed hyperlipidemia Patient is not currently on any medication for cholesterol levels.  She is taking Wegovy to aid in weight loss management and weight loss will help improve her cholesterol levels.  3. Bilateral lower extremity  edema Patient does have persistent lower extremity swelling and has been taking Lasix, Lasix dose increased, will follow-up in 1 to 2 months. - furosemide (LASIX) 40 MG tablet; Take 1 tablet (40 mg total) by mouth daily.  Dispense: 90 tablet; Refill: 1  4. Morbid obesity with BMI of 50.0-59.9, adult [facility]) Patient lost 5 to 8 pounds since her previous office visit taking Wegovy at 0.5 mg weekly.  Patient is ready to increase to the next dose, Wegovy dose increased to 1 mg weekly, will follow-up in 2 months. - Semaglutide-Weight Management 1 MG/0.5ML SOAJ; Inject 1 mg into the skin once a week.  Dispense: 2 mL; Refill: 3  General Counseling: swati granberry understanding of the findings of todays visit and agrees with plan of treatment. I have discussed any further diagnostic evaluation that may be needed or ordered today. We also reviewed her medications today. she has been encouraged to call the office with any questions or concerns that should arise related to todays visit.    No orders of the defined types were placed in this encounter.   Meds ordered this encounter  Medications   furosemide (LASIX) 40 MG tablet    Sig: Take 1 tablet (40 mg total) by mouth daily.    Dispense:  90 tablet    Refill:  1   DISCONTD: Semaglutide-Weight Management 1 MG/0.5ML SOAJ    Sig: Inject 1 mg into the skin once a week.    Dispense:  2 mL    Refill:  3    Please note increased dose, discontinue 0.5 mg prescription and fill new prescription asap. Dx code E35.01   Semaglutide-Weight Management 1 MG/0.5ML SOAJ    Sig: Inject 1 mg into the skin once a week.    Dispense:  2 mL    Refill:  3    Please note increased dose, discontinue 0.5 mg prescription and fill new prescription asap. Dx code E66.01    Return in about 2 months (around 04/23/2022) for F/U, Weight loss, Victoria Castaneda PCP.   Total time spent:30 Minutes Time spent includes review of chart, medications, test results, and follow up plan with  the patient.   [place] Controlled Substance Database was reviewed by me.  This patient was seen by Sallyanne Kuster, FNP-C in collaboration with Dr. Beverely Risen as a part of collaborative care agreement.   Tavarion Babington R. Tedd Sias, MSN, FNP-C Internal medicine

## 2022-02-22 ENCOUNTER — Encounter: Payer: Self-pay | Admitting: Nurse Practitioner

## 2022-02-27 ENCOUNTER — Other Ambulatory Visit: Payer: Self-pay | Admitting: Nurse Practitioner

## 2022-02-27 DIAGNOSIS — Z1231 Encounter for screening mammogram for malignant neoplasm of breast: Secondary | ICD-10-CM

## 2022-03-28 ENCOUNTER — Ambulatory Visit
Admission: RE | Admit: 2022-03-28 | Discharge: 2022-03-28 | Disposition: A | Discharge status: Home / Self Care | Payer: 59 | Source: Ambulatory Visit | Attending: Nurse Practitioner | Admitting: Nurse Practitioner

## 2022-03-28 DIAGNOSIS — Z1231 Encounter for screening mammogram for malignant neoplasm of breast: Secondary | ICD-10-CM | POA: Diagnosis not present

## 2022-04-18 ENCOUNTER — Ambulatory Visit: Payer: Managed Care, Other (non HMO) | Admitting: Nurse Practitioner

## 2022-04-21 ENCOUNTER — Encounter: Payer: 59 | Admitting: Nurse Practitioner

## 2022-05-01 ENCOUNTER — Encounter: Payer: Managed Care, Other (non HMO) | Admitting: Nurse Practitioner

## 2022-05-05 ENCOUNTER — Encounter: Payer: Managed Care, Other (non HMO) | Admitting: Nurse Practitioner

## 2022-06-06 ENCOUNTER — Encounter: Payer: Managed Care, Other (non HMO) | Admitting: Nurse Practitioner

## 2022-06-15 ENCOUNTER — Ambulatory Visit (INDEPENDENT_AMBULATORY_CARE_PROVIDER_SITE_OTHER): Payer: Managed Care, Other (non HMO) | Admitting: Nurse Practitioner

## 2022-06-15 ENCOUNTER — Encounter: Payer: Self-pay | Admitting: Nurse Practitioner

## 2022-06-15 VITALS — BP 136/72 | HR 95 | Temp 97.9°F | Resp 16 | Ht 62.0 in | Wt 310.0 lb

## 2022-06-15 DIAGNOSIS — Z23 Encounter for immunization: Secondary | ICD-10-CM

## 2022-06-15 DIAGNOSIS — R7303 Prediabetes: Secondary | ICD-10-CM | POA: Diagnosis not present

## 2022-06-15 DIAGNOSIS — J4530 Mild persistent asthma, uncomplicated: Secondary | ICD-10-CM

## 2022-06-15 DIAGNOSIS — R300 Dysuria: Secondary | ICD-10-CM | POA: Diagnosis not present

## 2022-06-15 DIAGNOSIS — E782 Mixed hyperlipidemia: Secondary | ICD-10-CM

## 2022-06-15 DIAGNOSIS — R7301 Impaired fasting glucose: Secondary | ICD-10-CM

## 2022-06-15 DIAGNOSIS — Z0001 Encounter for general adult medical examination with abnormal findings: Secondary | ICD-10-CM | POA: Diagnosis not present

## 2022-06-15 DIAGNOSIS — Z760 Encounter for issue of repeat prescription: Secondary | ICD-10-CM

## 2022-06-15 DIAGNOSIS — Z6841 Body Mass Index (BMI) 40.0 and over, adult: Secondary | ICD-10-CM

## 2022-06-15 DIAGNOSIS — I10 Essential (primary) hypertension: Secondary | ICD-10-CM

## 2022-06-15 DIAGNOSIS — R600 Localized edema: Secondary | ICD-10-CM

## 2022-06-15 DIAGNOSIS — E559 Vitamin D deficiency, unspecified: Secondary | ICD-10-CM

## 2022-06-15 LAB — POCT GLYCOSYLATED HEMOGLOBIN (HGB A1C): Hemoglobin A1C: 6.2 % — AB (ref 4.0–5.6)

## 2022-06-15 MED ORDER — FUROSEMIDE 40 MG PO TABS
40.0000 mg | ORAL_TABLET | Freq: Every day | ORAL | 1 refills | Status: DC
Start: 2022-06-15 — End: 2023-03-29

## 2022-06-15 MED ORDER — ERGOCALCIFEROL 1.25 MG (50000 UT) PO CAPS: 50000.0000 [IU] | ORAL_CAPSULE | ORAL | 5 refills | Status: DC

## 2022-06-15 MED ORDER — ALBUTEROL SULFATE HFA 108 (90 BASE) MCG/ACT IN AERS: 2.0000 | INHALATION_SPRAY | Freq: Four times a day (QID) | RESPIRATORY_TRACT | 0 refills | Status: DC | PRN

## 2022-06-15 MED ORDER — ZOSTER VAC RECOMB ADJUVANTED 50 MCG/0.5ML IM SUSR: 0.5000 mL | Freq: Once | INTRAMUSCULAR | 0 refills | Status: AC

## 2022-06-15 MED ORDER — FLUTICASONE-SALMETEROL 250-50 MCG/ACT IN AEPB: 1.0000 | INHALATION_SPRAY | Freq: Two times a day (BID) | RESPIRATORY_TRACT | 1 refills | Status: DC

## 2022-06-15 NOTE — Progress Notes (Signed)
[facility] [address] [place], [place] [address]  Internal MEDICINE  Office Visit Note  Patient Name: Victoria Castaneda  [id number]  [id number]  Date of Service: 06/15/2022  Chief Complaint  Patient presents with   Annual Exam   Hyperlipidemia   Asthma    HPI Heydi presents for an annual well visit and physical exam.   Well-appearing 56 year old female with hypertension, asthma, GERD, high cholesterol and osteoarthritis. --routine mammogram done in June this year --routine colonoscopy due in 2027. --pap smear due in April next year.  Labs were drawn in February. Cholesterol was abnormal A1c is 6.2 today which is prediabetic.  Gets SOB with minimal activity No new or worsening pain, no other concerns.     Current Medication: Outpatient Encounter Medications as of 06/15/2022  Medication Sig   albuterol (PROVENTIL) (2.5 MG/3ML) 0.083% nebulizer solution Take 2.5 mg by nebulization every 6 (six) hours as needed for wheezing or shortness of breath.   fluticasone (FLONASE) 50 MCG/ACT nasal spray Place into both nostrils daily.   ipratropium-albuterol (DUONEB) 0.5-2.5 (3) MG/3ML SOLN Take 3 mLs by nebulization every 6 (six) hours as needed.   meloxicam (MOBIC) 15 MG tablet Take 1 tablet (15 mg total) by mouth daily.   tiZANidine (ZANAFLEX) 4 MG tablet Take 1 tablet (4 mg total) by mouth at bedtime as needed for muscle spasms.   [DISCONTINUED] albuterol (VENTOLIN HFA) 108 (90 Base) MCG/ACT inhaler Inhale 2 puffs into the lungs every 6 (six) hours as needed for wheezing or shortness of breath.   [DISCONTINUED] ergocalciferol (DRISDOL) 1.25 MG (50000 UT) capsule Take 1 capsule (50,000 Units total) by mouth once a week.   [DISCONTINUED] fluticasone-salmeterol (ADVAIR DISKUS) 250-50 MCG/ACT AEPB Inhale 1 puff into the lungs 2 (two) times daily. in the morning and at bedtime.   [DISCONTINUED] furosemide (LASIX) 40 MG tablet Take 1 tablet (40 mg total) by mouth daily.    [DISCONTINUED] Semaglutide-Weight Management 1 MG/0.5ML SOAJ Inject 1 mg into the skin once a week.   [DISCONTINUED] Zoster Vaccine Adjuvanted [facility]) injection Inject 0.5 mLs into the muscle once.   albuterol (VENTOLIN HFA) 108 (90 Base) MCG/ACT inhaler Inhale 2 puffs into the lungs every 6 (six) hours as needed for wheezing or shortness of breath.   ergocalciferol (DRISDOL) 1.25 MG (50000 UT) capsule Take 1 capsule (50,000 Units total) by mouth once a week.   fluticasone-salmeterol (ADVAIR DISKUS) 250-50 MCG/ACT AEPB Inhale 1 puff into the lungs 2 (two) times daily. in the morning and at bedtime.   furosemide (LASIX) 40 MG tablet Take 1 tablet (40 mg total) by mouth daily.   Zoster Vaccine Adjuvanted [facility]) injection Inject 0.5 mLs into the muscle once for 1 dose.   No facility-administered encounter medications on file as of 06/15/2022.    Surgical History: Past Surgical History:  Procedure Laterality Date   ABDOMINAL HYSTERECTOMY     CHOLECYSTECTOMY     COLONOSCOPY WITH PROPOFOL N/A 12/10/2015   Procedure: COLONOSCOPY WITH PROPOFOL;  Surgeon: Midge Minium, MD;  Location: [facility];  Service: Endoscopy;  Laterality: N/A;    Medical History: Past Medical History:  Diagnosis Date   Arthritis    Asthma    Diverticulitis 06/2017   Headache    Hypersomnia, unspecified    Mild persistent asthma    Mixed hyperlipidemia    Motion sickness    Primary ovarian failure    Uncomplicated asthma    Vitamin D deficiency, unspecified    Wheezing  Family History: Family History  Problem Relation Age of Onset   Diabetes Mother    Breast cancer Paternal Aunt     Social History   Socioeconomic History   Marital status: Single    Spouse name: Not on file   Number of children: Not on file   Years of education: Not on file   Highest education level: Not on file  Occupational History   Not on file  Tobacco Use   Smoking status: Never   Smokeless tobacco: Never   Substance and Sexual Activity   Alcohol use: Yes    Comment: occasionally   Drug use: No   Sexual activity: Not on file  Other Topics Concern   Not on file  Social History Narrative   Not on file   Social Determinants of Health   Financial Resource Strain: Not on file  Food Insecurity: Not on file  Transportation Needs: Not on file  Physical Activity: Not on file  Stress: Not on file  Social Connections: Not on file  Intimate Partner Violence: Not on file      Review of Systems  Constitutional:  Negative for activity change, appetite change, chills, fatigue, fever and unexpected weight change.  HENT: Negative.  Negative for congestion, ear pain, rhinorrhea, sore throat and trouble swallowing.   Eyes: Negative.   Respiratory:  Positive for shortness of breath. Negative for cough, chest tightness and wheezing.   Cardiovascular: Negative.  Negative for chest pain.  Gastrointestinal: Negative.  Negative for abdominal pain, blood in stool, constipation, diarrhea, nausea and vomiting.  Endocrine: Negative.   Genitourinary: Negative.  Negative for difficulty urinating, dysuria, frequency, hematuria and urgency.  Musculoskeletal: Negative.  Negative for arthralgias, back pain, joint swelling, myalgias and neck pain.  Skin: Negative.  Negative for rash and wound.  Allergic/Immunologic: Negative.  Negative for immunocompromised state.  Neurological: Negative.  Negative for dizziness, seizures, numbness and headaches.  Hematological: Negative.   Psychiatric/Behavioral: Negative.  Negative for behavioral problems, self-injury and suicidal ideas. The patient is not nervous/anxious.     Vital Signs: BP 136/72   Pulse 95   Temp 97.9 F (36.6 C)   Resp 16   Ht 5\' 2"  (1.575 m)   Wt (!) 310 lb (140.6 kg)   SpO2 94%   BMI 56.70 kg/m    Physical Exam Vitals reviewed.  Constitutional:      General: She is not in acute distress.    Appearance: Normal appearance. She is  well-developed. She is obese. She is not ill-appearing or diaphoretic.  HENT:     Head: Normocephalic and atraumatic.     Right Ear: Tympanic membrane, ear canal and external ear normal.     Left Ear: Tympanic membrane, ear canal and external ear normal.     Nose: Nose normal. No congestion or rhinorrhea.     Mouth/Throat:     Mouth: Mucous membranes are moist.     Pharynx: Oropharynx is clear. No oropharyngeal exudate or posterior oropharyngeal erythema.  Eyes:     General: No scleral icterus.       Right eye: No discharge.        Left eye: No discharge.     Conjunctiva/sclera: Conjunctivae normal.     Pupils: Pupils are equal, round, and reactive to light.  Neck:     Thyroid: No thyromegaly.     Vascular: No JVD.     Trachea: No tracheal deviation.  Cardiovascular:     Rate and Rhythm: Normal  rate and regular rhythm.     Pulses: Normal pulses.     Heart sounds: Normal heart sounds. No murmur heard.    No friction rub. No gallop.  Pulmonary:     Effort: Pulmonary effort is normal. No respiratory distress.     Breath sounds: Normal breath sounds. No stridor. No wheezing or rales.  Chest:     Chest wall: No tenderness.  Abdominal:     General: Bowel sounds are normal. There is no distension.     Palpations: Abdomen is soft. There is no mass.     Tenderness: There is no abdominal tenderness. There is no guarding or rebound.     Hernia: No hernia is present.  Musculoskeletal:        General: No tenderness or deformity. Normal range of motion.     Cervical back: Normal range of motion and neck supple.     Right lower leg: No edema.     Left lower leg: No edema.  Lymphadenopathy:     Cervical: No cervical adenopathy.  Skin:    General: Skin is warm and dry.     Capillary Refill: Capillary refill takes less than 2 seconds.     Coloration: Skin is not pale.     Findings: No erythema or rash.  Neurological:     Mental Status: She is alert and oriented to person, place, and  time.     Cranial Nerves: No cranial nerve deficit.     Motor: No abnormal muscle tone.     Coordination: Coordination normal.     Deep Tendon Reflexes: Reflexes are normal and symmetric.  Psychiatric:        Mood and Affect: Mood normal.        Behavior: Behavior normal.        Thought Content: Thought content normal.        Judgment: Judgment normal.        Assessment/Plan: 1. Encounter for general adult medical examination with abnormal findings Age-appropriate preventive screenings and vaccinations discussed, annual physical exam completed. Routine labs for health maintenance ordered, see below. PHM updated.   2. Impaired fasting glucose Prior elevated fasting glucose, A1c checked today and was elevated as well - POCT glycosylated hemoglobin (Hb A1C)  3. Prediabetes Elevated A1c 6.2 today, was checked due to elevated fasting glucose levels.  - Lipid Profile  4. Morbid obesity with BMI of 50.0-59.9, adult (HCC) Repeat lipid panel, also continue diet and lifestyle modifications as previously discussed. - Lipid Profile  5. Dysuria Routine urinalysis done - UA/M w/rflx Culture, Routine - Microscopic Examination - Urine Culture, Reflex  6. Encounter for vaccination - Zoster Vaccine Adjuvanted [facility]) injection; Inject 0.5 mLs into the muscle once for 1 dose.  Dispense: 0.5 mL; Refill: 0  7. Medication refill - ergocalciferol (DRISDOL) 1.25 MG (50000 UT) capsule; Take 1 capsule (50,000 Units total) by mouth once a week.  Dispense: 4 capsule; Refill: 5 - furosemide (LASIX) 40 MG tablet; Take 1 tablet (40 mg total) by mouth daily.  Dispense: 90 tablet; Refill: 1 - fluticasone-salmeterol (ADVAIR DISKUS) 250-50 MCG/ACT AEPB; Inhale 1 puff into the lungs 2 (two) times daily. in the morning and at bedtime.  Dispense: 180 each; Refill: 1      General Counseling: mei suits understanding of the findings of todays visit and agrees with plan of treatment. I have  discussed any further diagnostic evaluation that may be needed or ordered today. We also reviewed her medications today. she  has been encouraged to call the office with any questions or concerns that should arise related to todays visit.    Orders Placed This Encounter  Procedures   UA/M w/rflx Culture, Routine   Lipid Profile   POCT glycosylated hemoglobin (Hb A1C)    Meds ordered this encounter  Medications   Zoster Vaccine Adjuvanted [facility]) injection    Sig: Inject 0.5 mLs into the muscle once for 1 dose.    Dispense:  0.5 mL    Refill:  0   ergocalciferol (DRISDOL) 1.25 MG (50000 UT) capsule    Sig: Take 1 capsule (50,000 Units total) by mouth once a week.    Dispense:  4 capsule    Refill:  5   furosemide (LASIX) 40 MG tablet    Sig: Take 1 tablet (40 mg total) by mouth daily.    Dispense:  90 tablet    Refill:  1   fluticasone-salmeterol (ADVAIR DISKUS) 250-50 MCG/ACT AEPB    Sig: Inhale 1 puff into the lungs 2 (two) times daily. in the morning and at bedtime.    Dispense:  180 each    Refill:  1   albuterol (VENTOLIN HFA) 108 (90 Base) MCG/ACT inhaler    Sig: Inhale 2 puffs into the lungs every 6 (six) hours as needed for wheezing or shortness of breath.    Dispense:  20.1 g    Refill:  0    **Patient requests 90 days supply**    Return in about 3 months (around 09/14/2022) for F/U, Recheck A1C, [place] PCP.   Total time spent:30 Minutes Time spent includes review of chart, medications, test results, and follow up plan with the patient.   [place] Controlled Substance Database was reviewed by me.  This patient was seen by Sallyanne Kuster, FNP-C in collaboration with Dr. Beverely Risen as a part of collaborative care agreement.  Fawnda Vitullo R. Tedd Sias, MSN, FNP-C Internal medicine

## 2022-06-16 LAB — LIPID PANEL
Chol/HDL Ratio: 3.3 ratio (ref 0.0–4.4)
Cholesterol, Total: 230 mg/dL — ABNORMAL HIGH (ref 100–199)
HDL: 70 mg/dL (ref >39)
LDL Chol Calc (NIH): 149 mg/dL — ABNORMAL HIGH (ref 0–99)
Triglycerides: 64 mg/dL (ref 0–149)
VLDL Cholesterol Cal: 11 mg/dL (ref 5–40)

## 2022-06-17 LAB — UA/M W/RFLX CULTURE, ROUTINE
Bilirubin, UA: NEGATIVE
Glucose, UA: NEGATIVE
Ketones, UA: NEGATIVE
Nitrite, UA: NEGATIVE
RBC, UA: NEGATIVE
Specific Gravity, UA: 1.026 (ref 1.005–1.030)
Urobilinogen, Ur: 0.2 mg/dL (ref 0.2–1.0)
pH, UA: 5.5 (ref 5.0–7.5)

## 2022-06-17 LAB — MICROSCOPIC EXAMINATION
Casts: NONE SEEN /lpf
Epithelial Cells (non renal): >10 /hpf — AB (ref 0–10)

## 2022-06-17 LAB — URINE CULTURE, REFLEX

## 2022-08-07 ENCOUNTER — Other Ambulatory Visit: Payer: Self-pay | Admitting: Physician Assistant

## 2022-08-07 DIAGNOSIS — J4530 Mild persistent asthma, uncomplicated: Secondary | ICD-10-CM

## 2022-08-13 ENCOUNTER — Encounter: Payer: Self-pay | Admitting: Nurse Practitioner

## 2022-08-14 ENCOUNTER — Telehealth: Payer: Self-pay | Admitting: Nurse Practitioner

## 2022-08-14 NOTE — Telephone Encounter (Signed)
Patient called requesting appointment for chest pain. Per Nimisha, okay to schedule, but patient instructed to go to ED if pain worsens-Toni

## 2022-08-15 ENCOUNTER — Ambulatory Visit: Payer: Managed Care, Other (non HMO) | Admitting: Nurse Practitioner

## 2022-08-15 ENCOUNTER — Encounter: Payer: Self-pay | Admitting: Nurse Practitioner

## 2022-08-15 ENCOUNTER — Ambulatory Visit
Admission: RE | Admit: 2022-08-15 | Discharge: 2022-08-15 | Disposition: A | Discharge status: Home / Self Care | Payer: 59 | Source: Ambulatory Visit | Attending: Nurse Practitioner | Admitting: Nurse Practitioner

## 2022-08-15 ENCOUNTER — Ambulatory Visit
Admission: RE | Admit: 2022-08-15 | Discharge: 2022-08-15 | Disposition: A | Discharge status: Home / Self Care | Payer: 59 | Attending: Nurse Practitioner | Admitting: Nurse Practitioner

## 2022-08-15 VITALS — BP 182/102 | HR 94 | Temp 98.2°F | Resp 16 | Ht 62.0 in | Wt 310.4 lb

## 2022-08-15 DIAGNOSIS — I259 Chronic ischemic heart disease, unspecified: Secondary | ICD-10-CM

## 2022-08-15 DIAGNOSIS — R0789 Other chest pain: Secondary | ICD-10-CM

## 2022-08-15 NOTE — Progress Notes (Signed)
[facility] [address] [place], [place] [address]  Internal MEDICINE  Office Visit Note  Patient Name: Victoria Castaneda  [id number]  [id number]  Date of Service: 08/15/2022  Chief Complaint  Patient presents with   Acute Visit    Chest pain for 3 or 4 days.      HPI Victoria Castaneda presents for an acute sick visit for chest pain  Chest pain 3-4 days Pressure across chest, comes and goes, 6 times per day, duration a few seconds to 1 minutes.  Increased SOB Left shoulder pain, has had pain in both arms, neck pain, some nausea, upper back pain No vomiting or epigastric pain,  EKG NSR, no MI or other issue.    Current Medication:  Outpatient Encounter Medications as of 08/15/2022  Medication Sig   albuterol (PROVENTIL) (2.5 MG/3ML) 0.083% nebulizer solution Take 2.5 mg by nebulization every 6 (six) hours as needed for wheezing or shortness of breath.   albuterol (VENTOLIN HFA) 108 (90 Base) MCG/ACT inhaler INHALE 2 PUFFS INTO THE LUNGS EVERY 6 HOURS AS NEEDED FOR WHEEZING OR SHORTNESS OF BREATH   ergocalciferol (DRISDOL) 1.25 MG (50000 UT) capsule Take 1 capsule (50,000 Units total) by mouth once a week.   fluticasone (FLONASE) 50 MCG/ACT nasal spray Place into both nostrils daily.   furosemide (LASIX) 40 MG tablet Take 1 tablet (40 mg total) by mouth daily.   ipratropium-albuterol (DUONEB) 0.5-2.5 (3) MG/3ML SOLN Take 3 mLs by nebulization every 6 (six) hours as needed.   meloxicam (MOBIC) 15 MG tablet Take 1 tablet (15 mg total) by mouth daily.   tiZANidine (ZANAFLEX) 4 MG tablet Take 1 tablet (4 mg total) by mouth at bedtime as needed for muscle spasms.   [DISCONTINUED] fluticasone-salmeterol (ADVAIR DISKUS) 250-50 MCG/ACT AEPB Inhale 1 puff into the lungs 2 (two) times daily. in the morning and at bedtime.   No facility-administered encounter medications on file as of 08/15/2022.      Medical History: Past Medical History:  Diagnosis Date   Arthritis    Asthma     Diverticulitis 06/2017   Headache    Hypersomnia, unspecified    Mild persistent asthma    Mixed hyperlipidemia    Motion sickness    Primary ovarian failure    Uncomplicated asthma    Vitamin D deficiency, unspecified    Wheezing      Vital Signs: BP (!) 182/102   Pulse 94   Temp 98.2 F (36.8 C)   Resp 16   Ht 5\' 2"  (1.575 m)   Wt (!) 310 lb 6.4 oz (140.8 kg)   SpO2 98%   BMI 56.77 kg/m    Review of Systems  Constitutional:  Positive for fatigue. Negative for chills and unexpected weight change.  HENT:  Negative for congestion, postnasal drip, rhinorrhea, sneezing and sore throat.   Eyes:  Negative for redness.  Respiratory:  Positive for chest tightness and shortness of breath. Negative for cough and wheezing.   Cardiovascular:  Positive for chest pain and palpitations.  Gastrointestinal:  Negative for abdominal pain, constipation, diarrhea, nausea and vomiting.  Genitourinary:  Negative for dysuria and frequency.  Musculoskeletal:  Negative for arthralgias, back pain, joint swelling and neck pain.  Skin:  Negative for rash.  Neurological: Negative.  Negative for tremors and numbness.  Hematological:  Negative for adenopathy. Does not bruise/bleed easily.  Psychiatric/Behavioral:  Negative for behavioral problems (Depression), sleep disturbance and suicidal ideas. The patient is not nervous/anxious.  Physical Exam Vitals reviewed.  Constitutional:      General: She is not in acute distress.    Appearance: Normal appearance. She is obese. She is not ill-appearing.  HENT:     Head: Normocephalic and atraumatic.  Eyes:     Pupils: Pupils are equal, round, and reactive to light.  Cardiovascular:     Rate and Rhythm: Normal rate and regular rhythm.     Heart sounds: Normal heart sounds. No murmur heard. Pulmonary:     Effort: Pulmonary effort is normal. No respiratory distress.     Breath sounds: Normal breath sounds.  Neurological:     Mental Status: She is  alert and oriented to person, place, and time.  Psychiatric:        Mood and Affect: Mood normal.        Behavior: Behavior normal.       Assessment/Plan: 1. Atypical chest pain Normal EKG. Labs ordered, and chest xray rule out other possible causes. Most likely it is a somatic manifestation of anxiety. Patient is feeling better no chest pain now. Follow up to discuss results of imaging and testing.  - EKG 12-Lead - DG Chest 2 View; Future - CBC with Differential/Platelet - BMP8+1AC - Brain natriuretic peptide; Future - Troponin T   General Counseling: Victoria Castaneda understanding of the findings of todays visit and agrees with plan of treatment. I have discussed any further diagnostic evaluation that may be needed or ordered today. We also reviewed her medications today. she has been encouraged to call the office with any questions or concerns that should arise related to todays visit.    Counseling:    Orders Placed This Encounter  Procedures   DG Chest 2 View   CBC with Differential/Platelet   BMP8+1AC   Brain natriuretic peptide   Troponin T   EKG 12-Lead    No orders of the defined types were placed in this encounter.   Return in about 3 weeks (around 09/05/2022) for F/U, Labs, [place] PCP.  [place] Controlled Substance Database was reviewed by me for overdose risk score (ORS)  Time spent:20 Minutes Time spent with patient included reviewing progress notes, labs, imaging studies, and discussing plan for follow up.   This patient was seen by Jonetta Osgood, FNP-C in collaboration with Dr. Clayborn Bigness as a part of collaborative care agreement.  Victoria Pe R. Valetta Fuller, MSN, FNP-C Internal Medicine

## 2022-08-16 LAB — BMP8+1AC
BUN/Creatinine Ratio: 9 (ref 9–23)
BUN: 6 mg/dL (ref 6–24)
CO2: 28 mmol/L (ref 20–29)
Calcium: 9.4 mg/dL (ref 8.7–10.2)
Chloride: 102 mmol/L (ref 96–106)
Creatinine, Ser: 0.67 mg/dL (ref 0.57–1.00)
Glucose: 64 mg/dL — ABNORMAL LOW (ref 70–99)
Potassium: 4.4 mmol/L (ref 3.5–5.2)
Sodium: 144 mmol/L (ref 134–144)
Uric Acid: 4.8 mg/dL (ref 3.0–7.2)
eGFR: 103 mL/min/{1.73_m2} (ref >59)

## 2022-08-16 LAB — CBC WITH DIFFERENTIAL/PLATELET
Basophils Absolute: 0.1 10*3/uL (ref 0.0–0.2)
Basos: 1 %
EOS (ABSOLUTE): 0.5 10*3/uL — ABNORMAL HIGH (ref 0.0–0.4)
Eos: 6 %
Hematocrit: 40.6 % (ref 34.0–46.6)
Hemoglobin: 13.4 g/dL (ref 11.1–15.9)
Immature Grans (Abs): 0 10*3/uL (ref 0.0–0.1)
Immature Granulocytes: 0 %
Lymphocytes Absolute: 2.4 10*3/uL (ref 0.7–3.1)
Lymphs: 26 %
MCH: 28.2 pg (ref 26.6–33.0)
MCHC: 33 g/dL (ref 31.5–35.7)
MCV: 86 fL (ref 79–97)
Monocytes Absolute: 0.8 10*3/uL (ref 0.1–0.9)
Monocytes: 9 %
Neutrophils Absolute: 5.4 10*3/uL (ref 1.4–7.0)
Neutrophils: 58 %
Platelets: 421 10*3/uL (ref 150–450)
RBC: 4.75 x10E6/uL (ref 3.77–5.28)
RDW: 13.2 % (ref 11.7–15.4)
WBC: 9.1 10*3/uL (ref 3.4–10.8)

## 2022-08-16 LAB — TROPONIN T: Troponin T (Highly Sensitive): 6 ng/L (ref 0–14)

## 2022-09-13 ENCOUNTER — Ambulatory Visit: Payer: Managed Care, Other (non HMO) | Admitting: Nurse Practitioner

## 2022-09-13 ENCOUNTER — Encounter: Payer: Self-pay | Admitting: Nurse Practitioner

## 2022-09-13 VITALS — BP 130/80 | HR 91 | Temp 97.7°F | Resp 16 | Ht 62.0 in | Wt 311.8 lb

## 2022-09-13 DIAGNOSIS — E782 Mixed hyperlipidemia: Secondary | ICD-10-CM

## 2022-09-13 DIAGNOSIS — I10 Essential (primary) hypertension: Secondary | ICD-10-CM

## 2022-09-13 DIAGNOSIS — Z6841 Body Mass Index (BMI) 40.0 and over, adult: Secondary | ICD-10-CM

## 2022-09-13 DIAGNOSIS — R7303 Prediabetes: Secondary | ICD-10-CM

## 2022-09-13 DIAGNOSIS — M65331 Trigger finger, right middle finger: Secondary | ICD-10-CM | POA: Diagnosis not present

## 2022-09-13 LAB — POCT GLYCOSYLATED HEMOGLOBIN (HGB A1C): Hemoglobin A1C: 6.1 % — AB (ref 4.0–5.6)

## 2022-09-13 NOTE — Progress Notes (Signed)
[facility] [address] [place], [place] [address]  Internal MEDICINE  Office Visit Note  Patient Name: Victoria Castaneda  [id number]  [id number]  Date of Service: 09/13/2022  Chief Complaint  Patient presents with   Follow-up   Hyperlipidemia    HPI Emmilee presents for a follow up visit for prediabetes, hypertension, high cholesterol and weight loss.  Prediabetes -- A1c slightly improved to 6.1.  Hypertension -- BP elevated but improved when rechecked.  High cholesterol -- encouraged diet modifications. Overall risk not increased. Has been working on diet, no weight gain noted.  Wants to talk about weight loss soon in January or February once the new options are available.  Has trigger finger of right middle finger     Current Medication: Outpatient Encounter Medications as of 09/13/2022  Medication Sig   albuterol (PROVENTIL) (2.5 MG/3ML) 0.083% nebulizer solution Take 2.5 mg by nebulization every 6 (six) hours as needed for wheezing or shortness of breath.   albuterol (VENTOLIN HFA) 108 (90 Base) MCG/ACT inhaler INHALE 2 PUFFS INTO THE LUNGS EVERY 6 HOURS AS NEEDED FOR WHEEZING OR SHORTNESS OF BREATH   ergocalciferol (DRISDOL) 1.25 MG (50000 UT) capsule Take 1 capsule (50,000 Units total) by mouth once a week.   fluticasone (FLONASE) 50 MCG/ACT nasal spray Place into both nostrils daily.   fluticasone-salmeterol (ADVAIR DISKUS) 250-50 MCG/ACT AEPB Inhale 1 puff into the lungs 2 (two) times daily. in the morning and at bedtime.   furosemide (LASIX) 40 MG tablet Take 1 tablet (40 mg total) by mouth daily.   ipratropium-albuterol (DUONEB) 0.5-2.5 (3) MG/3ML SOLN Take 3 mLs by nebulization every 6 (six) hours as needed.   meloxicam (MOBIC) 15 MG tablet Take 1 tablet (15 mg total) by mouth daily.   tiZANidine (ZANAFLEX) 4 MG tablet Take 1 tablet (4 mg total) by mouth at bedtime as needed for muscle spasms.   No facility-administered encounter medications on file as of  09/13/2022.    Surgical History: Past Surgical History:  Procedure Laterality Date   ABDOMINAL HYSTERECTOMY     CHOLECYSTECTOMY     COLONOSCOPY WITH PROPOFOL N/A 12/10/2015   Procedure: COLONOSCOPY WITH PROPOFOL;  Surgeon: Midge Minium, MD;  Location: [facility];  Service: Endoscopy;  Laterality: N/A;    Medical History: Past Medical History:  Diagnosis Date   Arthritis    Asthma    Diverticulitis 06/2017   Headache    Hypersomnia, unspecified    Mild persistent asthma    Mixed hyperlipidemia    Motion sickness    Primary ovarian failure    Uncomplicated asthma    Vitamin D deficiency, unspecified    Wheezing     Family History: Family History  Problem Relation Age of Onset   Diabetes Mother    Breast cancer Paternal Aunt     Social History   Socioeconomic History   Marital status: Single    Spouse name: Not on file   Number of children: Not on file   Years of education: Not on file   Highest education level: Not on file  Occupational History   Not on file  Tobacco Use   Smoking status: Never   Smokeless tobacco: Never  Substance and Sexual Activity   Alcohol use: Yes    Comment: occasionally   Drug use: No   Sexual activity: Not on file  Other Topics Concern   Not on file  Social History Narrative   Not on file   Social Determinants of  Health   Financial Resource Strain: Not on file  Food Insecurity: Not on file  Transportation Needs: Not on file  Physical Activity: Not on file  Stress: Not on file  Social Connections: Not on file  Intimate Partner Violence: Not on file      Review of Systems  Constitutional:  Negative for chills, fatigue and unexpected weight change.  HENT:  Negative for congestion, rhinorrhea, sneezing and sore throat.   Respiratory: Negative.  Negative for cough, chest tightness, shortness of breath and wheezing.   Cardiovascular: Negative.  Negative for chest pain and palpitations.  Gastrointestinal:  Negative for  abdominal pain, constipation, diarrhea, nausea and vomiting.  Musculoskeletal:  Negative for arthralgias, back pain, joint swelling and neck pain.  Neurological: Negative.   Psychiatric/Behavioral:  Behavioral problem: Depression.     Vital Signs: BP 130/80   Pulse 91   Temp 97.7 F (36.5 C)   Resp 16   Ht 5\' 2"  (1.575 m)   Wt (!) 311 lb 12.8 oz (141.4 kg)   SpO2 99%   BMI 57.03 kg/m    Physical Exam Vitals reviewed.  Constitutional:      Appearance: Normal appearance.  HENT:     Head: Normocephalic and atraumatic.  Eyes:     Pupils: Pupils are equal, round, and reactive to light.  Cardiovascular:     Rate and Rhythm: Normal rate and regular rhythm.  Pulmonary:     Effort: Pulmonary effort is normal. No respiratory distress.  Neurological:     Mental Status: She is alert and oriented to person, place, and time.  Psychiatric:        Mood and Affect: Mood normal.        Behavior: Behavior normal.        Assessment/Plan: 1. Prediabetes Improving, repeat A1c in 3 months.  - POCT glycosylated hemoglobin (Hb A1C)  2. Essential hypertension Stable with current medication   3. Mixed hyperlipidemia Encouraged diet and lifestyle modifications, wants to work on this herself, declined medication when offered.   4. Acquired trigger finger of right middle finger Information provided, referral offered but declined.   5. Morbid obesity with BMI of 50.0-59.9, adult [facility]) Will discuss further in January or february   General Counseling: zanae kuehnle understanding of the findings of todays visit and agrees with plan of treatment. I have discussed any further diagnostic evaluation that may be needed or ordered today. We also reviewed her medications today. she has been encouraged to call the office with any questions or concerns that should arise related to todays visit.    Orders Placed This Encounter  Procedures   POCT glycosylated hemoglobin (Hb A1C)    No  orders of the defined types were placed in this encounter.   Return in about 2 months (around 11/14/2022) for F/U, Weight loss, Hartlee Amedee PCP.   Total time spent:30 Minutes Time spent includes review of chart, medications, test results, and follow up plan with the patient.   [place] Controlled Substance Database was reviewed by me.  This patient was seen by 01/13/2023, FNP-C in collaboration with Dr. Sallyanne Kuster as a part of collaborative care agreement.   Lovey Crupi R. Beverely Risen, MSN, FNP-C Internal medicine

## 2022-09-14 ENCOUNTER — Ambulatory Visit: Payer: Managed Care, Other (non HMO) | Admitting: Nurse Practitioner

## 2022-09-27 ENCOUNTER — Other Ambulatory Visit: Payer: Self-pay | Admitting: Nurse Practitioner

## 2022-09-27 DIAGNOSIS — Z760 Encounter for issue of repeat prescription: Secondary | ICD-10-CM

## 2022-09-30 ENCOUNTER — Encounter: Payer: Self-pay | Admitting: Nurse Practitioner

## 2022-10-25 ENCOUNTER — Other Ambulatory Visit: Payer: Self-pay | Admitting: Nurse Practitioner

## 2022-10-25 DIAGNOSIS — Z760 Encounter for issue of repeat prescription: Secondary | ICD-10-CM

## 2022-10-27 ENCOUNTER — Telehealth: Payer: Self-pay

## 2022-10-27 NOTE — Telephone Encounter (Signed)
Patient PA was approved for Advair Diskus and patient was notified.

## 2022-11-14 ENCOUNTER — Ambulatory Visit: Payer: Managed Care, Other (non HMO) | Admitting: Nurse Practitioner

## 2022-11-14 ENCOUNTER — Encounter: Payer: Self-pay | Admitting: Nurse Practitioner

## 2022-11-14 VITALS — BP 125/85 | HR 100 | Temp 97.0°F | Resp 16 | Ht 62.0 in | Wt 309.4 lb

## 2022-11-14 DIAGNOSIS — G8929 Other chronic pain: Secondary | ICD-10-CM

## 2022-11-14 DIAGNOSIS — M545 Low back pain, unspecified: Secondary | ICD-10-CM | POA: Diagnosis not present

## 2022-11-14 DIAGNOSIS — R7303 Prediabetes: Secondary | ICD-10-CM | POA: Diagnosis not present

## 2022-11-14 DIAGNOSIS — Z6841 Body Mass Index (BMI) 40.0 and over, adult: Secondary | ICD-10-CM

## 2022-11-14 MED ORDER — MELOXICAM 15 MG PO TABS: 15.0000 mg | ORAL_TABLET | Freq: Every day | ORAL | 1 refills | Status: DC

## 2022-11-14 MED ORDER — TIRZEPATIDE-WEIGHT MANAGEMENT 5 MG/0.5ML ~~LOC~~ SOAJ: 5.0000 mg | SUBCUTANEOUS | 2 refills | Status: DC

## 2022-11-14 NOTE — Progress Notes (Signed)
[facility], [place] [address]  Internal MEDICINE  Office Visit Note  Patient Name: Victoria Castaneda  [id number]  [id number]  Date of Service: 11/14/2022  Chief Complaint  Patient presents with   Follow-up    Weight loss     HPI Victoria Castaneda presents for a follow-up visit for weight loss management and arthritis.  Weight loss management -- has tried phentermine but this eventually caused palpitations. Has tried wegovy but this caused dysphagia and neck tightness. Has been working on diet and exercise independently. Is interested in trying something new.  Arthritis -- takes meloxicam sometimes, has not had any in a while. Would like to get refills.  Prediabetes -- weight loss will help glucose and A1c to improve.     Current Medication: Outpatient Encounter Medications as of 11/14/2022  Medication Sig   ADVAIR DISKUS 250-50 MCG/ACT AEPB INHALE 1 PUFF INTO THE LUNGS TWICE DAILY IN THE MORNING AND AT BEDTIME   albuterol (PROVENTIL) (2.5 MG/3ML) 0.083% nebulizer solution Take 2.5 mg by nebulization every 6 (six) hours as needed for wheezing or shortness of breath.   albuterol (VENTOLIN HFA) 108 (90 Base) MCG/ACT inhaler INHALE 2 PUFFS INTO THE LUNGS EVERY 6 HOURS AS NEEDED FOR WHEEZING OR SHORTNESS OF BREATH   ergocalciferol (DRISDOL) 1.25 MG (50000 UT) capsule Take 1 capsule (50,000 Units total) by mouth once a week.   fluticasone (FLONASE) 50 MCG/ACT nasal spray Place into both nostrils daily.   furosemide (LASIX) 40 MG tablet Take 1 tablet (40 mg total) by mouth daily.   ipratropium-albuterol (DUONEB) 0.5-2.5 (3) MG/3ML SOLN Take 3 mLs by nebulization every 6 (six) hours as needed.   tirzepatide (ZEPBOUND) 5 MG/0.5ML Pen Inject 5 mg into the skin once a week.   tiZANidine (ZANAFLEX) 4 MG tablet Take 1 tablet (4 mg total) by mouth at bedtime as needed for muscle spasms.   [DISCONTINUED] meloxicam (MOBIC) 15 MG tablet Take 1 tablet (15 mg total) by mouth daily.    meloxicam (MOBIC) 15 MG tablet Take 1 tablet (15 mg total) by mouth daily.   No facility-administered encounter medications on file as of 11/14/2022.    Surgical History: Past Surgical History:  Procedure Laterality Date   ABDOMINAL HYSTERECTOMY     CHOLECYSTECTOMY     COLONOSCOPY WITH PROPOFOL N/A 12/10/2015   Procedure: COLONOSCOPY WITH PROPOFOL;  Surgeon: Lucilla Lame, MD;  Location: [facility];  Service: Endoscopy;  Laterality: N/A;    Medical History: Past Medical History:  Diagnosis Date   Arthritis    Asthma    Diverticulitis 06/2017   Headache    Hypersomnia, unspecified    Mild persistent asthma    Mixed hyperlipidemia    Motion sickness    Primary ovarian failure    Uncomplicated asthma    Vitamin D deficiency, unspecified    Wheezing     Family History: Family History  Problem Relation Age of Onset   Diabetes Mother    Breast cancer Paternal Aunt     Social History   Socioeconomic History   Marital status: Single    Spouse name: Not on file   Number of children: Not on file   Years of education: Not on file   Highest education level: Not on file  Occupational History   Not on file  Tobacco Use   Smoking status: Never   Smokeless tobacco: Never  Substance and Sexual Activity   Alcohol use: Yes    Comment: occasionally  Drug use: No   Sexual activity: Not on file  Other Topics Concern   Not on file  Social History Narrative   Not on file   Social Determinants of Health   Financial Resource Strain: Not on file  Food Insecurity: Not on file  Transportation Needs: Not on file  Physical Activity: Not on file  Stress: Not on file  Social Connections: Not on file  Intimate Partner Violence: Not on file      Review of Systems  Constitutional:  Negative for chills, fatigue and unexpected weight change.  HENT:  Negative for congestion, rhinorrhea, sneezing and sore throat.   Respiratory: Negative.  Negative for cough, chest tightness,  shortness of breath and wheezing.   Cardiovascular: Negative.  Negative for chest pain and palpitations.  Gastrointestinal:  Negative for abdominal pain, constipation, diarrhea, nausea and vomiting.  Musculoskeletal:  Negative for arthralgias, back pain, joint swelling and neck pain.  Neurological: Negative.   Psychiatric/Behavioral:  Behavioral problem: Depression.     Vital Signs: BP 125/85 Comment: 150/88  Pulse 100   Temp (!) 97 F (36.1 C)   Resp 16   Ht 5\' 2"  (1.575 m)   Wt (!) 309 lb 6.4 oz (140.3 kg)   SpO2 93%   BMI 56.59 kg/m    Physical Exam Vitals reviewed.  Constitutional:      Appearance: Normal appearance.  HENT:     Head: Normocephalic and atraumatic.  Eyes:     Pupils: Pupils are equal, round, and reactive to light.  Cardiovascular:     Rate and Rhythm: Normal rate and regular rhythm.  Pulmonary:     Effort: Pulmonary effort is normal. No respiratory distress.  Neurological:     Mental Status: She is alert and oriented to person, place, and time.  Psychiatric:        Mood and Affect: Mood normal.        Behavior: Behavior normal.        Assessment/Plan: 1. Chronic right-sided low back pain without sciatica Continue meloxicam as prescribed.  - meloxicam (MOBIC) 15 MG tablet; Take 1 tablet (15 mg total) by mouth daily.  Dispense: 90 tablet; Refill: 1  2. Prediabetes Will improve with weight loss.   3. Morbid obesity with BMI of 50.0-59.9, adult ([place]) Will try to get zepbound approved if her insurance will cover. Encouraged patient to also call and check with her insurance. If zepbound is not covered, will have consult scheduled with Dr. Clayborn Bigness for additional guidance and recommendation regarding obesity and weight loss management.  - tirzepatide (ZEPBOUND) 5 MG/0.5ML Pen; Inject 5 mg into the skin once a week.  Dispense: 2 mL; Refill: 2   General Counseling: Victoria Castaneda verbalizes understanding of the findings of todays visit and agrees with plan  of treatment. I have discussed any further diagnostic evaluation that may be needed or ordered today. We also reviewed her medications today. she has been encouraged to call the office with any questions or concerns that should arise related to todays visit.    No orders of the defined types were placed in this encounter.   Meds ordered this encounter  Medications   tirzepatide (ZEPBOUND) 5 MG/0.5ML Pen    Sig: Inject 5 mg into the skin once a week.    Dispense:  2 mL    Refill:  2    Dx code E66.01, has tried phentermine and wegovy previously.   meloxicam (MOBIC) 15 MG tablet    Sig: Take  1 tablet (15 mg total) by mouth daily.    Dispense:  90 tablet    Refill:  1    Return in about 1 month (around 12/13/2022) for F/U, Weight loss, Alizea Pell PCP.   Total time spent:30 Minutes Time spent includes review of chart, medications, test results, and follow up plan with the patient.   [place] Controlled Substance Database was reviewed by me.  This patient was seen by Jonetta Osgood, FNP-C in collaboration with Dr. Clayborn Bigness as a part of collaborative care agreement.   Endora Teresi R. Valetta Fuller, MSN, FNP-C Internal medicine

## 2022-12-13 ENCOUNTER — Ambulatory Visit: Payer: Managed Care, Other (non HMO) | Admitting: Nurse Practitioner

## 2023-03-01 ENCOUNTER — Other Ambulatory Visit: Payer: Self-pay | Admitting: Internal Medicine

## 2023-03-01 DIAGNOSIS — Z1231 Encounter for screening mammogram for malignant neoplasm of breast: Secondary | ICD-10-CM

## 2023-03-29 ENCOUNTER — Other Ambulatory Visit: Payer: Self-pay

## 2023-03-29 DIAGNOSIS — Z760 Encounter for issue of repeat prescription: Secondary | ICD-10-CM

## 2023-03-29 MED ORDER — FUROSEMIDE 40 MG PO TABS: 40.0000 mg | ORAL_TABLET | Freq: Every day | ORAL | 0 refills | Status: DC

## 2023-04-11 ENCOUNTER — Ambulatory Visit
Admission: RE | Admit: 2023-04-11 | Discharge: 2023-04-11 | Disposition: A | Discharge status: Home / Self Care | Payer: Managed Care, Other (non HMO) | Source: Ambulatory Visit | Attending: Internal Medicine | Admitting: Internal Medicine

## 2023-04-11 ENCOUNTER — Encounter: Payer: Self-pay | Admitting: Nurse Practitioner

## 2023-04-11 ENCOUNTER — Ambulatory Visit (INDEPENDENT_AMBULATORY_CARE_PROVIDER_SITE_OTHER): Payer: Managed Care, Other (non HMO) | Admitting: Nurse Practitioner

## 2023-04-11 VITALS — BP 130/88 | HR 90 | Temp 98.5°F | Resp 16 | Ht 62.0 in | Wt 305.6 lb

## 2023-04-11 DIAGNOSIS — R7303 Prediabetes: Secondary | ICD-10-CM | POA: Diagnosis not present

## 2023-04-11 DIAGNOSIS — Z1231 Encounter for screening mammogram for malignant neoplasm of breast: Secondary | ICD-10-CM | POA: Diagnosis present

## 2023-04-11 DIAGNOSIS — Z6841 Body Mass Index (BMI) 40.0 and over, adult: Secondary | ICD-10-CM | POA: Diagnosis not present

## 2023-04-11 DIAGNOSIS — Z760 Encounter for issue of repeat prescription: Secondary | ICD-10-CM

## 2023-04-11 DIAGNOSIS — J4530 Mild persistent asthma, uncomplicated: Secondary | ICD-10-CM

## 2023-04-11 MED ORDER — ALBUTEROL SULFATE HFA 108 (90 BASE) MCG/ACT IN AERS: 2.0000 | INHALATION_SPRAY | Freq: Four times a day (QID) | RESPIRATORY_TRACT | 3 refills | Status: DC | PRN

## 2023-04-11 MED ORDER — FLUTICASONE-SALMETEROL 250-50 MCG/ACT IN AEPB: INHALATION_SPRAY | RESPIRATORY_TRACT | 1 refills | Status: DC

## 2023-04-11 NOTE — Progress Notes (Signed)
[facility] [address] [place], [place] [address]  Internal MEDICINE  Office Visit Note  Patient Name: Victoria Castaneda  [id number]  [id number]  Date of Service: 04/11/2023  Chief Complaint  Patient presents with   Hyperlipidemia   Follow-up    Weight loss, finger locking up middle finger on right hand     HPI Victoria Castaneda presents for a follow-up visit for weight loss management, asthma and prediabetes.  Weight loss -- lost 5 lbs without medication, great job!  Asthma -- using advair inhaler and albuterol rescue inhaler  Prediabetes -- her last A1c was 6.1 in December. Her A1c should improve as she loses weight.  Has a trigger finger on right hand, middle finger.     Current Medication: Outpatient Encounter Medications as of 04/11/2023  Medication Sig   albuterol (PROVENTIL) (2.5 MG/3ML) 0.083% nebulizer solution Take 2.5 mg by nebulization every 6 (six) hours as needed for wheezing or shortness of breath.   ergocalciferol (DRISDOL) 1.25 MG (50000 UT) capsule Take 1 capsule (50,000 Units total) by mouth once a week.   fluticasone (FLONASE) 50 MCG/ACT nasal spray Place into both nostrils daily.   furosemide (LASIX) 40 MG tablet Take 1 tablet (40 mg total) by mouth daily.   ipratropium-albuterol (DUONEB) 0.5-2.5 (3) MG/3ML SOLN Take 3 mLs by nebulization every 6 (six) hours as needed.   meloxicam (MOBIC) 15 MG tablet Take 1 tablet (15 mg total) by mouth daily.   tiZANidine (ZANAFLEX) 4 MG tablet Take 1 tablet (4 mg total) by mouth at bedtime as needed for muscle spasms.   [DISCONTINUED] ADVAIR DISKUS 250-50 MCG/ACT AEPB INHALE 1 PUFF INTO THE LUNGS TWICE DAILY IN THE MORNING AND AT BEDTIME   [DISCONTINUED] albuterol (VENTOLIN HFA) 108 (90 Base) MCG/ACT inhaler INHALE 2 PUFFS INTO THE LUNGS EVERY 6 HOURS AS NEEDED FOR WHEEZING OR SHORTNESS OF BREATH   [DISCONTINUED] tirzepatide (ZEPBOUND) 5 MG/0.5ML Pen Inject 5 mg into the skin once a week.   albuterol (VENTOLIN HFA) 108 (90  Base) MCG/ACT inhaler Inhale 2 puffs into the lungs every 6 (six) hours as needed for wheezing or shortness of breath.   fluticasone-salmeterol (ADVAIR DISKUS) 250-50 MCG/ACT AEPB INHALE 1 PUFF INTO THE LUNGS TWICE DAILY IN THE MORNING AND AT BEDTIME   No facility-administered encounter medications on file as of 04/11/2023.    Surgical History: Past Surgical History:  Procedure Laterality Date   ABDOMINAL HYSTERECTOMY     CHOLECYSTECTOMY     COLONOSCOPY WITH PROPOFOL N/A 12/10/2015   Procedure: COLONOSCOPY WITH PROPOFOL;  Surgeon: Victoria Minium, MD;  Location: [facility];  Service: Endoscopy;  Laterality: N/A;    Medical History: Past Medical History:  Diagnosis Date   Arthritis    Asthma    Diverticulitis 06/2017   Headache    Hypersomnia, unspecified    Mild persistent asthma    Mixed hyperlipidemia    Motion sickness    Primary ovarian failure    Uncomplicated asthma    Vitamin D deficiency, unspecified    Wheezing     Family History: Family History  Problem Relation Age of Onset   Diabetes Mother    Breast cancer Paternal Aunt     Social History   Socioeconomic History   Marital status: Single    Spouse name: Not on file   Number of children: Not on file   Years of education: Not on file   Highest education level: Not on file  Occupational History   Not on  file  Tobacco Use   Smoking status: Never   Smokeless tobacco: Never  Substance and Sexual Activity   Alcohol use: Yes    Comment: occasionally   Drug use: No   Sexual activity: Not on file  Other Topics Concern   Not on file  Social History Narrative   Not on file   Social Determinants of Health   Financial Resource Strain: Not on file  Food Insecurity: Not on file  Transportation Needs: Not on file  Physical Activity: Not on file  Stress: Not on file  Social Connections: Not on file  Intimate Partner Violence: Not on file      Review of Systems  Constitutional:  Negative for  chills, fatigue and unexpected weight change.  HENT:  Negative for congestion, rhinorrhea, sneezing and sore throat.   Respiratory: Negative.  Negative for cough, chest tightness, shortness of breath and wheezing.   Cardiovascular: Negative.  Negative for chest pain and palpitations.  Gastrointestinal:  Negative for abdominal pain, constipation, diarrhea, nausea and vomiting.  Musculoskeletal:  Negative for arthralgias, back pain, joint swelling and neck pain.  Neurological: Negative.   Psychiatric/Behavioral:  Behavioral problem: Depression.     Vital Signs: BP 130/88   Pulse 90   Temp 98.5 F (36.9 C)   Resp 16   Ht 5\' 2"  (1.575 m)   Wt (!) 305 lb 9.6 oz (138.6 kg)   SpO2 99%   BMI 55.89 kg/m    Physical Exam Vitals reviewed.  Constitutional:      Appearance: Normal appearance.  HENT:     Head: Normocephalic and atraumatic.  Eyes:     Pupils: Pupils are equal, round, and reactive to light.  Cardiovascular:     Rate and Rhythm: Normal rate and regular rhythm.  Pulmonary:     Effort: Pulmonary effort is normal. No respiratory distress.  Neurological:     Mental Status: She is alert and oriented to person, place, and time.  Psychiatric:        Mood and Affect: Mood normal.        Behavior: Behavior normal.        Assessment/Plan: 1. Mild persistent asthma, uncomplicated Stable, continue advair and albuterol inhaler as prescribed.  - fluticasone-salmeterol (ADVAIR DISKUS) 250-50 MCG/ACT AEPB; INHALE 1 PUFF INTO THE LUNGS TWICE DAILY IN THE MORNING AND AT BEDTIME  Dispense: 180 each; Refill: 1 - albuterol (VENTOLIN HFA) 108 (90 Base) MCG/ACT inhaler; Inhale 2 puffs into the lungs every 6 (six) hours as needed for wheezing or shortness of breath.  Dispense: 17 g; Refill: 3  2. Prediabetes Repeat A1c at next office visit.   3. Morbid obesity with BMI of 50.0-59.9, adult ([facility]) Continue diet and lifestyle modifications. Review information about contrave and look up  other medications as discussed, call clinic if she decides to take any specific weight loss medication.    General Counseling: kersha hainer understanding of the findings of todays visit and agrees with plan of treatment. I have discussed any further diagnostic evaluation that may be needed or ordered today. We also reviewed her medications today. she has been encouraged to call the office with any questions or concerns that should arise related to todays visit.    No orders of the defined types were placed in this encounter.   Meds ordered this encounter  Medications   fluticasone-salmeterol (ADVAIR DISKUS) 250-50 MCG/ACT AEPB    Sig: INHALE 1 PUFF INTO THE LUNGS TWICE DAILY IN THE MORNING AND AT  BEDTIME    Dispense:  180 each    Refill:  1   albuterol (VENTOLIN HFA) 108 (90 Base) MCG/ACT inhaler    Sig: Inhale 2 puffs into the lungs every 6 (six) hours as needed for wheezing or shortness of breath.    Dispense:  17 g    Refill:  3    Return for previously scheduled, F/U, Breyah Akhter PCP in september.   Total time spent:30 Minutes Time spent includes review of chart, medications, test results, and follow up plan with the patient.   [place] Controlled Substance Database was reviewed by me.  This patient was seen by Sallyanne Kuster, FNP-C in collaboration with Dr. Beverely Risen as a part of collaborative care agreement.   Meleni Delahunt R. Tedd Sias, MSN, FNP-C Internal medicine

## 2023-04-13 ENCOUNTER — Encounter: Payer: Self-pay | Admitting: Nurse Practitioner

## 2023-05-17 ENCOUNTER — Other Ambulatory Visit: Payer: Self-pay | Admitting: Nurse Practitioner

## 2023-05-17 DIAGNOSIS — M545 Low back pain, unspecified: Secondary | ICD-10-CM

## 2023-06-07 ENCOUNTER — Ambulatory Visit (INDEPENDENT_AMBULATORY_CARE_PROVIDER_SITE_OTHER): Payer: Managed Care, Other (non HMO) | Admitting: Nurse Practitioner

## 2023-06-07 ENCOUNTER — Encounter: Payer: Self-pay | Admitting: Nurse Practitioner

## 2023-06-07 VITALS — BP 132/78 | HR 100 | Temp 98.2°F | Resp 16 | Ht 62.0 in | Wt 302.0 lb

## 2023-06-07 DIAGNOSIS — K5732 Diverticulitis of large intestine without perforation or abscess without bleeding: Secondary | ICD-10-CM | POA: Diagnosis not present

## 2023-06-07 DIAGNOSIS — R1084 Generalized abdominal pain: Secondary | ICD-10-CM | POA: Diagnosis not present

## 2023-06-07 MED ORDER — METRONIDAZOLE 500 MG PO TABS: 500.0000 mg | ORAL_TABLET | Freq: Two times a day (BID) | ORAL | 0 refills | Status: AC

## 2023-06-07 MED ORDER — SULFAMETHOXAZOLE-TRIMETHOPRIM 800-160 MG PO TABS: 1.0000 | ORAL_TABLET | Freq: Two times a day (BID) | ORAL | 0 refills | Status: AC

## 2023-06-07 MED ORDER — TRAMADOL HCL 50 MG PO TABS: 50.0000 mg | ORAL_TABLET | Freq: Four times a day (QID) | ORAL | 0 refills | Status: AC | PRN

## 2023-06-07 NOTE — Progress Notes (Signed)
[facility] [address] [place], [place] [address]  Internal MEDICINE  Office Visit Note  Patient Name: Victoria Castaneda  [id number]  [id number]  Date of Service: 06/07/2023  Chief Complaint  Patient presents with   Acute Visit    Stomach pain     HPI Adeola presents for an acute sick visit for diverticulitis flare up Has happened before, has previously been treated with a combination of cipro and metronidazole. Nausea, abdominal pain, fecal urgency, mucous BM, sometimes diarrhea, severe cramping.       Current Medication:  Outpatient Encounter Medications as of 06/07/2023  Medication Sig   albuterol (PROVENTIL) (2.5 MG/3ML) 0.083% nebulizer solution Take 2.5 mg by nebulization every 6 (six) hours as needed for wheezing or shortness of breath.   albuterol (VENTOLIN HFA) 108 (90 Base) MCG/ACT inhaler Inhale 2 puffs into the lungs every 6 (six) hours as needed for wheezing or shortness of breath.   ergocalciferol (DRISDOL) 1.25 MG (50000 UT) capsule Take 1 capsule (50,000 Units total) by mouth once a week.   fluticasone (FLONASE) 50 MCG/ACT nasal spray Place into both nostrils daily.   fluticasone-salmeterol (ADVAIR DISKUS) 250-50 MCG/ACT AEPB INHALE 1 PUFF INTO THE LUNGS TWICE DAILY IN THE MORNING AND AT BEDTIME   furosemide (LASIX) 40 MG tablet Take 1 tablet (40 mg total) by mouth daily.   ipratropium-albuterol (DUONEB) 0.5-2.5 (3) MG/3ML SOLN Take 3 mLs by nebulization every 6 (six) hours as needed.   meloxicam (MOBIC) 15 MG tablet TAKE 1 TABLET(15 MG) BY MOUTH DAILY   metroNIDAZOLE (FLAGYL) 500 MG tablet Take 1 tablet (500 mg total) by mouth 2 (two) times daily for 7 days. Take with food   sulfamethoxazole-trimethoprim (BACTRIM DS) 800-160 MG tablet Take 1 tablet by mouth 2 (two) times daily for 7 days. Take with food   tiZANidine (ZANAFLEX) 4 MG tablet Take 1 tablet (4 mg total) by mouth at bedtime as needed for muscle spasms.   traMADol (ULTRAM) 50 MG tablet Take 1  tablet (50 mg total) by mouth every 6 (six) hours as needed for up to 5 days for moderate pain or severe pain.   No facility-administered encounter medications on file as of 06/07/2023.      Medical History: Past Medical History:  Diagnosis Date   Arthritis    Asthma    Diverticulitis 06/2017   Headache    Hypersomnia, unspecified    Mild persistent asthma    Mixed hyperlipidemia    Motion sickness    Primary ovarian failure    Uncomplicated asthma    Vitamin D deficiency, unspecified    Wheezing      Vital Signs: BP 132/78   Pulse 100   Temp 98.2 F (36.8 C)   Resp 16   Ht 5\' 2"  (1.575 m)   Wt (!) 302 lb (137 kg)   SpO2 96%   BMI 55.24 kg/m    Review of Systems  Constitutional:  Positive for appetite change and fatigue. Negative for chills, diaphoresis and fever.  HENT: Negative.    Respiratory: Negative.  Negative for cough, chest tightness, shortness of breath and wheezing.   Cardiovascular: Negative.  Negative for chest pain and palpitations.  Gastrointestinal:  Positive for abdominal distention, abdominal pain, diarrhea and nausea.    Physical Exam Vitals reviewed.  Constitutional:      Appearance: Normal appearance.  HENT:     Head: Normocephalic and atraumatic.  Eyes:     Pupils: Pupils are equal, round, and reactive  to light.  Cardiovascular:     Rate and Rhythm: Normal rate and regular rhythm.  Pulmonary:     Effort: Pulmonary effort is normal. No respiratory distress.  Abdominal:     General: There is distension.     Tenderness: There is abdominal tenderness. There is guarding.  Neurological:     Mental Status: She is alert and oriented to person, place, and time.  Psychiatric:        Mood and Affect: Mood normal.        Behavior: Behavior normal.       Assessment/Plan: 1. Diverticulitis of large intestine without perforation or abscess without bleeding Bactrim and metronidazole prescribed as per current treatment guidelines. Tramadol  prescribed for pain  - sulfamethoxazole-trimethoprim (BACTRIM DS) 800-160 MG tablet; Take 1 tablet by mouth 2 (two) times daily for 7 days. Take with food  Dispense: 14 tablet; Refill: 0 - metroNIDAZOLE (FLAGYL) 500 MG tablet; Take 1 tablet (500 mg total) by mouth 2 (two) times daily for 7 days. Take with food  Dispense: 14 tablet; Refill: 0 - traMADol (ULTRAM) 50 MG tablet; Take 1 tablet (50 mg total) by mouth every 6 (six) hours as needed for up to 5 days for moderate pain or severe pain.  Dispense: 20 tablet; Refill: 0  2. Generalized abdominal pain Tramadol prescribed.  - traMADol (ULTRAM) 50 MG tablet; Take 1 tablet (50 mg total) by mouth every 6 (six) hours as needed for up to 5 days for moderate pain or severe pain.  Dispense: 20 tablet; Refill: 0   General Counseling: tennia schnepper understanding of the findings of todays visit and agrees with plan of treatment. I have discussed any further diagnostic evaluation that may be needed or ordered today. We also reviewed her medications today. she has been encouraged to call the office with any questions or concerns that should arise related to todays visit.    Counseling:    No orders of the defined types were placed in this encounter.   Meds ordered this encounter  Medications   sulfamethoxazole-trimethoprim (BACTRIM DS) 800-160 MG tablet    Sig: Take 1 tablet by mouth 2 (two) times daily for 7 days. Take with food    Dispense:  14 tablet    Refill:  0   metroNIDAZOLE (FLAGYL) 500 MG tablet    Sig: Take 1 tablet (500 mg total) by mouth 2 (two) times daily for 7 days. Take with food    Dispense:  14 tablet    Refill:  0   traMADol (ULTRAM) 50 MG tablet    Sig: Take 1 tablet (50 mg total) by mouth every 6 (six) hours as needed for up to 5 days for moderate pain or severe pain.    Dispense:  20 tablet    Refill:  0    Fill new script    Return if symptoms worsen or fail to improve.  [place] Controlled Substance Database was  reviewed by me for overdose risk score (ORS)  Time spent:20 Minutes Time spent with patient included reviewing progress notes, labs, imaging studies, and discussing plan for follow up.   This patient was seen by Sallyanne Kuster, FNP-C in collaboration with Dr. Beverely Risen as a part of collaborative care agreement.  Ermalinda Joubert R. Tedd Sias, MSN, FNP-C Internal Medicine

## 2023-06-21 ENCOUNTER — Ambulatory Visit (INDEPENDENT_AMBULATORY_CARE_PROVIDER_SITE_OTHER): Payer: Managed Care, Other (non HMO) | Admitting: Nurse Practitioner

## 2023-06-21 ENCOUNTER — Encounter: Payer: Self-pay | Admitting: Nurse Practitioner

## 2023-06-21 VITALS — BP 130/86 | HR 88 | Temp 97.9°F | Resp 16 | Ht 62.0 in | Wt 302.6 lb

## 2023-06-21 DIAGNOSIS — Z23 Encounter for immunization: Secondary | ICD-10-CM | POA: Diagnosis not present

## 2023-06-21 DIAGNOSIS — R7303 Prediabetes: Secondary | ICD-10-CM | POA: Diagnosis not present

## 2023-06-21 DIAGNOSIS — R600 Localized edema: Secondary | ICD-10-CM

## 2023-06-21 DIAGNOSIS — Z0001 Encounter for general adult medical examination with abnormal findings: Secondary | ICD-10-CM | POA: Diagnosis not present

## 2023-06-21 DIAGNOSIS — B3731 Acute candidiasis of vulva and vagina: Secondary | ICD-10-CM

## 2023-06-21 DIAGNOSIS — R0602 Shortness of breath: Secondary | ICD-10-CM

## 2023-06-21 DIAGNOSIS — E559 Vitamin D deficiency, unspecified: Secondary | ICD-10-CM

## 2023-06-21 DIAGNOSIS — E538 Deficiency of other specified B group vitamins: Secondary | ICD-10-CM | POA: Diagnosis not present

## 2023-06-21 DIAGNOSIS — E782 Mixed hyperlipidemia: Secondary | ICD-10-CM | POA: Diagnosis not present

## 2023-06-21 MED ORDER — FUROSEMIDE 40 MG PO TABS: 40.0000 mg | ORAL_TABLET | Freq: Every day | ORAL | 1 refills | Status: DC

## 2023-06-21 MED ORDER — FLUCONAZOLE 150 MG PO TABS: 150.0000 mg | ORAL_TABLET | Freq: Once | ORAL | 0 refills | Status: AC

## 2023-06-21 NOTE — Progress Notes (Signed)
[facility] [address] [place], [place] [address]  Internal MEDICINE  Office Visit Note  Patient Name: Victoria Castaneda  [id number]  [id number]  Date of Service: 06/21/2023  Chief Complaint  Patient presents with   Hyperlipidemia   Annual Exam    HPI Terriona presents for an annual well visit and physical exam.  Well-appearing 57 y.o. female with prediabetes, hypertension, asthma, GERD, high cholesterol and osteoarthritis.  Routine CRC screening: due in 2027 Routine mammogram: done in July this year.  Had hysterectomy in 2008. Kept both ovaries.  Labs: due for routine labs  New or worsening pain: none Other concerns: needs to get flu shot Has history of SOB and bilateral lower extremity edema, have not had BNP level checked.    Current Medication: Outpatient Encounter Medications as of 06/21/2023  Medication Sig   albuterol (VENTOLIN HFA) 108 (90 Base) MCG/ACT inhaler Inhale 2 puffs into the lungs every 6 (six) hours as needed for wheezing or shortness of breath.   ergocalciferol (DRISDOL) 1.25 MG (50000 UT) capsule Take 1 capsule (50,000 Units total) by mouth once a week.   fluconazole (DIFLUCAN) 150 MG tablet Take 1 tablet (150 mg total) by mouth once for 1 dose. May take an additional dose after 3 days if still symptomatic.   fluticasone (FLONASE) 50 MCG/ACT nasal spray Place into both nostrils daily.   fluticasone-salmeterol (ADVAIR DISKUS) 250-50 MCG/ACT AEPB INHALE 1 PUFF INTO THE LUNGS TWICE DAILY IN THE MORNING AND AT BEDTIME   meloxicam (MOBIC) 15 MG tablet TAKE 1 TABLET(15 MG) BY MOUTH DAILY   [DISCONTINUED] albuterol (PROVENTIL) (2.5 MG/3ML) 0.083% nebulizer solution Take 2.5 mg by nebulization every 6 (six) hours as needed for wheezing or shortness of breath.   [DISCONTINUED] furosemide (LASIX) 40 MG tablet Take 1 tablet (40 mg total) by mouth daily.   [DISCONTINUED] ipratropium-albuterol (DUONEB) 0.5-2.5 (3) MG/3ML SOLN Take 3 mLs by nebulization every 6  (six) hours as needed.   [DISCONTINUED] tiZANidine (ZANAFLEX) 4 MG tablet Take 1 tablet (4 mg total) by mouth at bedtime as needed for muscle spasms.   furosemide (LASIX) 40 MG tablet Take 1 tablet (40 mg total) by mouth daily.   No facility-administered encounter medications on file as of 06/21/2023.    Surgical History: Past Surgical History:  Procedure Laterality Date   ABDOMINAL HYSTERECTOMY     CHOLECYSTECTOMY     COLONOSCOPY WITH PROPOFOL N/A 12/10/2015   Procedure: COLONOSCOPY WITH PROPOFOL;  Surgeon: Midge Minium, MD;  Location: [facility];  Service: Endoscopy;  Laterality: N/A;    Medical History: Past Medical History:  Diagnosis Date   Arthritis    Asthma    Diverticulitis 06/2017   Headache    Hypersomnia, unspecified    Mild persistent asthma    Mixed hyperlipidemia    Motion sickness    Primary ovarian failure    Uncomplicated asthma    Vitamin D deficiency, unspecified    Wheezing     Family History: Family History  Problem Relation Age of Onset   Diabetes Mother    Breast cancer Paternal Aunt     Social History   Socioeconomic History   Marital status: Single    Spouse name: Not on file   Number of children: Not on file   Years of education: Not on file   Highest education level: Not on file  Occupational History   Not on file  Tobacco Use   Smoking status: Never   Smokeless tobacco: Never  Substance and Sexual Activity   Alcohol use: Yes    Comment: occasionally   Drug use: No   Sexual activity: Not on file  Other Topics Concern   Not on file  Social History Narrative   Not on file   Social Determinants of Health   Financial Resource Strain: Not on file  Food Insecurity: Not on file  Transportation Needs: Not on file  Physical Activity: Not on file  Stress: Not on file  Social Connections: Not on file  Intimate Partner Violence: Not on file      Review of Systems  Constitutional:  Negative for activity change, appetite  change, chills, fatigue, fever and unexpected weight change.  HENT: Negative.  Negative for congestion, ear pain, rhinorrhea, sore throat and trouble swallowing.   Eyes: Negative.   Respiratory:  Positive for shortness of breath (intermittent). Negative for cough, chest tightness and wheezing.   Cardiovascular: Negative.  Negative for chest pain.  Gastrointestinal: Negative.  Negative for abdominal pain, blood in stool, constipation, diarrhea, nausea and vomiting.  Endocrine: Negative.   Genitourinary: Negative.  Negative for difficulty urinating, dysuria, frequency, hematuria and urgency.  Musculoskeletal: Negative.  Negative for arthralgias, back pain, joint swelling, myalgias and neck pain.  Skin: Negative.  Negative for rash and wound.  Allergic/Immunologic: Negative.  Negative for immunocompromised state.  Neurological: Negative.  Negative for dizziness, seizures, numbness and headaches.  Hematological: Negative.   Psychiatric/Behavioral: Negative.  Negative for behavioral problems, self-injury and suicidal ideas. The patient is not nervous/anxious.     Vital Signs: BP 130/86   Pulse 88   Temp 97.9 F (36.6 C)   Resp 16   Ht 5\' 2"  (1.575 m)   Wt (!) 302 lb 9.6 oz (137.3 kg)   SpO2 93%   BMI 55.35 kg/m    Physical Exam Vitals reviewed.  Constitutional:      General: She is not in acute distress.    Appearance: Normal appearance. She is well-developed. She is obese. She is not ill-appearing or diaphoretic.  HENT:     Head: Normocephalic and atraumatic.     Right Ear: Tympanic membrane, ear canal and external ear normal.     Left Ear: Tympanic membrane, ear canal and external ear normal.     Nose: Nose normal. No congestion or rhinorrhea.     Mouth/Throat:     Mouth: Mucous membranes are moist.     Pharynx: Oropharynx is clear. No oropharyngeal exudate or posterior oropharyngeal erythema.  Eyes:     General: No scleral icterus.       Right eye: No discharge.        Left  eye: No discharge.     Conjunctiva/sclera: Conjunctivae normal.     Pupils: Pupils are equal, round, and reactive to light.  Neck:     Thyroid: No thyromegaly.     Vascular: No JVD.     Trachea: No tracheal deviation.  Cardiovascular:     Rate and Rhythm: Normal rate and regular rhythm.     Pulses: Normal pulses.     Heart sounds: Normal heart sounds. No murmur heard.    No friction rub. No gallop.  Pulmonary:     Effort: Pulmonary effort is normal. No respiratory distress.     Breath sounds: Normal breath sounds. No stridor. No wheezing or rales.  Chest:     Chest wall: No tenderness.  Abdominal:     General: Bowel sounds are normal. There is no distension.  Palpations: Abdomen is soft. There is no mass.     Tenderness: There is no abdominal tenderness. There is no guarding or rebound.     Hernia: No hernia is present.  Musculoskeletal:        General: No tenderness or deformity. Normal range of motion.     Cervical back: Normal range of motion and neck supple.     Right lower leg: No edema.     Left lower leg: No edema.  Lymphadenopathy:     Cervical: No cervical adenopathy.  Skin:    General: Skin is warm and dry.     Capillary Refill: Capillary refill takes less than 2 seconds.     Coloration: Skin is not pale.     Findings: No erythema or rash.  Neurological:     Mental Status: She is alert and oriented to person, place, and time.     Cranial Nerves: No cranial nerve deficit.     Motor: No abnormal muscle tone.     Coordination: Coordination normal.     Deep Tendon Reflexes: Reflexes are normal and symmetric.  Psychiatric:        Mood and Affect: Mood normal.        Behavior: Behavior normal.        Thought Content: Thought content normal.        Judgment: Judgment normal.        Assessment/Plan: 1. Encounter for routine adult health examination with abnormal findings Age-appropriate preventive screenings and vaccinations discussed, annual physical exam  completed. Routine labs for health maintenance ordered, see below. Routine medication refills ordered. PHM updated.  - CBC with Differential/Platelet - CMP14+EGFR - Lipid Profile - Hgb A1C w/o eAG - Vitamin D (25 hydroxy) - B12 and Folate Panel - Iron, TIBC and Ferritin Panel - Pro b natriuretic peptide (BNP)[facility]) - furosemide (LASIX) 40 MG tablet; Take 1 tablet (40 mg total) by mouth daily.  Dispense: 90 tablet; Refill: 1  2. Prediabetes Routine labs ordered  - CBC with Differential/Platelet - CMP14+EGFR - Lipid Profile - Hgb A1C w/o eAG  3. Mixed hyperlipidemia Routine labs ordered  - CMP14+EGFR - Lipid Profile - Hgb A1C w/o eAG  4. Bilateral lower extremity edema BNP lab ordered. Continue furosemide as prescribed. - Pro b natriuretic peptide (BNP)[facility] [facility] [facility]) - furosemide (LASIX) 40 MG tablet; Take 1 tablet (40 mg total) by mouth daily.  Dispense: 90 tablet; Refill: 1  5. SOB (shortness of breath) BNP lab ordered  - Pro b natriuretic peptide (BNP)[facility])  6. Vulvovaginal candidiasis Fluconazole prescribed.  - fluconazole (DIFLUCAN) 150 MG tablet; Take 1 tablet (150 mg total) by mouth once for 1 dose. May take an additional dose after 3 days if still symptomatic.  Dispense: 3 tablet; Refill: 0  7. B12 deficiency Routine labs ordered  - CBC with Differential/Platelet - B12 and Folate Panel - Iron, TIBC and Ferritin Panel  8. Vitamin D deficiency Routine lab ordered  - Vitamin D (25 hydroxy)  9. Needs flu shot Flu vaccine administered in office today - Influenza, MDCK, trivalent, PF(Flucelvax egg-free)      General Counseling: steve scheible understanding of the findings of todays visit and agrees with plan of treatment. I have discussed any further diagnostic evaluation that may be needed or ordered today. We also reviewed her medications today. she has been encouraged to call the  office with any questions or concerns that should arise related to todays visit.  Orders Placed This Encounter  Procedures   Influenza, MDCK, trivalent, PF(Flucelvax egg-free)   CBC with Differential/Platelet   CMP14+EGFR   Lipid Profile   Hgb A1C w/o eAG   Vitamin D (25 hydroxy)   B12 and Folate Panel   Iron, TIBC and Ferritin Panel   Pro b natriuretic peptide (BNP)[facility] [facility])    Meds ordered this encounter  Medications   fluconazole (DIFLUCAN) 150 MG tablet    Sig: Take 1 tablet (150 mg total) by mouth once for 1 dose. May take an additional dose after 3 days if still symptomatic.    Dispense:  3 tablet    Refill:  0   furosemide (LASIX) 40 MG tablet    Sig: Take 1 tablet (40 mg total) by mouth daily.    Dispense:  90 tablet    Refill:  1    Return in about 6 months (around 12/19/2023) for F/U, Candelario Steppe PCP and also will call for appt if has significant abnormals on labs. .   Total time spent:30 Minutes Time spent includes review of chart, medications, test results, and follow up plan with the patient.   [place] Controlled Substance Database was reviewed by me.  This patient was seen by Sallyanne Kuster, FNP-C in collaboration with Dr. Beverely Risen as a part of collaborative care agreement.  Takai Chiaramonte R. Tedd Sias, MSN, FNP-C Internal medicine

## 2023-06-22 LAB — CMP14+EGFR
ALT: 10 IU/L (ref 0–32)
AST: 10 IU/L (ref 0–40)
Albumin: 4 g/dL (ref 3.8–4.9)
Alkaline Phosphatase: 61 IU/L (ref 44–121)
BUN/Creatinine Ratio: 16 (ref 9–23)
BUN: 12 mg/dL (ref 6–24)
Bilirubin Total: 0.4 mg/dL (ref 0.0–1.2)
CO2: 29 mmol/L (ref 20–29)
Calcium: 9.5 mg/dL (ref 8.7–10.2)
Chloride: 104 mmol/L (ref 96–106)
Creatinine, Ser: 0.76 mg/dL (ref 0.57–1.00)
Globulin, Total: 3 g/dL (ref 1.5–4.5)
Glucose: 93 mg/dL (ref 70–99)
Potassium: 4.4 mmol/L (ref 3.5–5.2)
Sodium: 144 mmol/L (ref 134–144)
Total Protein: 7 g/dL (ref 6.0–8.5)
eGFR: 91 mL/min/{1.73_m2} (ref >59)

## 2023-06-22 LAB — CBC WITH DIFFERENTIAL/PLATELET
Basophils Absolute: 0.1 10*3/uL (ref 0.0–0.2)
Basos: 1 %
EOS (ABSOLUTE): 0.4 10*3/uL (ref 0.0–0.4)
Eos: 4 %
Hematocrit: 40.9 % (ref 34.0–46.6)
Hemoglobin: 13 g/dL (ref 11.1–15.9)
Immature Grans (Abs): 0 10*3/uL (ref 0.0–0.1)
Immature Granulocytes: 0 %
Lymphocytes Absolute: 2.2 10*3/uL (ref 0.7–3.1)
Lymphs: 25 %
MCH: 27.8 pg (ref 26.6–33.0)
MCHC: 31.8 g/dL (ref 31.5–35.7)
MCV: 88 fL (ref 79–97)
Monocytes Absolute: 0.7 10*3/uL (ref 0.1–0.9)
Monocytes: 7 %
Neutrophils Absolute: 5.5 10*3/uL (ref 1.4–7.0)
Neutrophils: 63 %
Platelets: 438 10*3/uL (ref 150–450)
RBC: 4.67 x10E6/uL (ref 3.77–5.28)
RDW: 13.2 % (ref 11.7–15.4)
WBC: 8.9 10*3/uL (ref 3.4–10.8)

## 2023-06-22 LAB — HGB A1C W/O EAG: Hgb A1c MFr Bld: 6.4 % — ABNORMAL HIGH (ref 4.8–5.6)

## 2023-06-22 LAB — LIPID PANEL
Chol/HDL Ratio: 3.3 ratio (ref 0.0–4.4)
Cholesterol, Total: 231 mg/dL — ABNORMAL HIGH (ref 100–199)
HDL: 71 mg/dL (ref >39)
LDL Chol Calc (NIH): 148 mg/dL — ABNORMAL HIGH (ref 0–99)
Triglycerides: 68 mg/dL (ref 0–149)
VLDL Cholesterol Cal: 12 mg/dL (ref 5–40)

## 2023-06-22 LAB — B12 AND FOLATE PANEL
Folate: 8.4 ng/mL (ref >3.0)
Vitamin B-12: 428 pg/mL (ref 232–1245)

## 2023-06-22 LAB — IRON,TIBC AND FERRITIN PANEL
Ferritin: 126 ng/mL (ref 15–150)
Iron Saturation: 27 % (ref 15–55)
Iron: 77 ug/dL (ref 27–159)
Total Iron Binding Capacity: 283 ug/dL (ref 250–450)
UIBC: 206 ug/dL (ref 131–425)

## 2023-06-22 LAB — VITAMIN D 25 HYDROXY (VIT D DEFICIENCY, FRACTURES): Vit D, 25-Hydroxy: 17 ng/mL — ABNORMAL LOW (ref 30.0–100.0)

## 2023-06-22 LAB — PRO B NATRIURETIC PEPTIDE: NT-Pro BNP: 57 pg/mL (ref 0–287)

## 2023-06-29 ENCOUNTER — Other Ambulatory Visit: Payer: Self-pay | Admitting: Nurse Practitioner

## 2023-06-29 DIAGNOSIS — Z0001 Encounter for general adult medical examination with abnormal findings: Secondary | ICD-10-CM

## 2023-06-29 DIAGNOSIS — R600 Localized edema: Secondary | ICD-10-CM

## 2023-08-18 ENCOUNTER — Other Ambulatory Visit: Payer: Self-pay | Admitting: Nurse Practitioner

## 2023-08-18 MED ORDER — VITAMIN D (ERGOCALCIFEROL) 1.25 MG (50000 UNIT) PO CAPS: 50000.0000 [IU] | ORAL_CAPSULE | ORAL | 1 refills | Status: DC

## 2023-08-18 NOTE — Progress Notes (Signed)
Low vitamin D, I sent weekly supplement to restart and continue Cholesterol and LDL are still elevated, the rest of the panel is normal, limit high fast, and high cholesterol foods. Limit red meat intake and increase lean proteins in diet, please send low cholesterol diet information to patient. Will discuss repeating lab at her appt in march and then see if medication should be started.  A1c increased to 6.4, please monitor carbs and sugars and choose lower carb options. Limit starches and sweets Cbc and cmp are normal B12 and folate, and iron panel are normal BNP is normal -- cardiac marker for heart failure

## 2023-08-20 ENCOUNTER — Telehealth: Payer: Self-pay

## 2023-08-20 NOTE — Telephone Encounter (Signed)
-----   Message from [place] sent at 08/18/2023  9:23 AM EST ----- Low vitamin D, I sent weekly supplement to restart and continue Cholesterol and LDL are still elevated, the rest of the panel is normal, limit high fast, and high cholesterol foods. Limit red meat intake and increase lean proteins in diet, please send low cholesterol diet information to patient. Will discuss repeating lab at her appt in march and then see if medication should be started.  A1c increased to 6.4, please monitor carbs and sugars and choose lower carb options. Limit starches and sweets Cbc and cmp are normal B12 and folate, and iron panel are normal BNP is normal -- cardiac marker for heart failure

## 2023-08-20 NOTE — Telephone Encounter (Signed)
Patient notified

## 2023-08-27 ENCOUNTER — Ambulatory Visit: Payer: Managed Care, Other (non HMO) | Admitting: Internal Medicine

## 2023-08-28 ENCOUNTER — Encounter: Payer: Self-pay | Admitting: Internal Medicine

## 2023-08-28 ENCOUNTER — Ambulatory Visit (INDEPENDENT_AMBULATORY_CARE_PROVIDER_SITE_OTHER): Payer: Managed Care, Other (non HMO) | Admitting: Internal Medicine

## 2023-08-28 VITALS — BP 110/80 | HR 89 | Temp 98.2°F | Resp 16 | Ht 62.0 in | Wt 306.2 lb

## 2023-08-28 DIAGNOSIS — E1165 Type 2 diabetes mellitus with hyperglycemia: Secondary | ICD-10-CM

## 2023-08-28 DIAGNOSIS — E661 Drug-induced obesity: Secondary | ICD-10-CM

## 2023-08-28 DIAGNOSIS — Z6841 Body Mass Index (BMI) 40.0 and over, adult: Secondary | ICD-10-CM

## 2023-08-28 DIAGNOSIS — E66813 Obesity, class 3: Secondary | ICD-10-CM | POA: Diagnosis not present

## 2023-08-28 DIAGNOSIS — Z8481 Family history of carrier of genetic disease: Secondary | ICD-10-CM

## 2023-08-28 DIAGNOSIS — E782 Mixed hyperlipidemia: Secondary | ICD-10-CM

## 2023-08-28 MED ORDER — ATORVASTATIN CALCIUM 10 MG PO TABS: 10.0000 mg | ORAL_TABLET | Freq: Every day | ORAL | 3 refills | Status: DC

## 2023-08-28 MED ORDER — LANCET DEVICE MISC: 1.0000 | Freq: Every day | 0 refills | Status: AC | PRN

## 2023-08-28 MED ORDER — LANCETS MISC. MISC: 1.0000 | Freq: Three times a day (TID) | 0 refills | Status: AC

## 2023-08-28 MED ORDER — BLOOD GLUCOSE MONITORING SUPPL DEVI: 0 refills | Status: AC

## 2023-08-28 MED ORDER — BLOOD GLUCOSE TEST VI STRP: ORAL_STRIP | 0 refills | Status: DC

## 2023-08-28 NOTE — Progress Notes (Signed)
[facility] [address] [place], [place] [address]  Internal MEDICINE  Office Visit Note  Patient Name: Victoria Castaneda  [id number]  [id number]  Date of Service: 08/28/2023  Chief Complaint  Patient presents with   Follow-up    Review Labs   Hyperlipidemia   Quality Metric Gaps    Shingles Vaccine    HPI Pt is seen for f/u  Abnormal lipid profile  Worsening hg A1c Works from home , likes sweets  Strong fhx of breast cancer, will like to get tested Wants to lose wt but has been hard     Current Medication: Outpatient Encounter Medications as of 08/28/2023  Medication Sig   albuterol (VENTOLIN HFA) 108 (90 Base) MCG/ACT inhaler Inhale 2 puffs into the lungs every 6 (six) hours as needed for wheezing or shortness of breath.   atorvastatin (LIPITOR) 10 MG tablet Take 1 tablet (10 mg total) by mouth daily.   Blood Glucose Monitoring Suppl DEVI May substitute to any manufacturer covered by patient's insurance.for E11.65   fluticasone (FLONASE) 50 MCG/ACT nasal spray Place into both nostrils daily.   fluticasone-salmeterol (ADVAIR DISKUS) 250-50 MCG/ACT AEPB INHALE 1 PUFF INTO THE LUNGS TWICE DAILY IN THE MORNING AND AT BEDTIME   furosemide (LASIX) 40 MG tablet Take 1 tablet (40 mg total) by mouth daily.   Glucose Blood (BLOOD GLUCOSE TEST STRIPS) STRP Check blood sugar once a day ( 11.65) May substitute to any manufacturer covered by patient's insurance.   Lancet Device MISC 1 each by Does not apply route daily as needed. May substitute to any manufacturer covered by patient's insurance.   Lancets Misc. MISC 1 each by Does not apply route in the morning, at noon, and at bedtime. May substitute to any manufacturer covered by patient's insurance.   meloxicam (MOBIC) 15 MG tablet TAKE 1 TABLET(15 MG) BY MOUTH DAILY   Vitamin D, Ergocalciferol, (DRISDOL) 1.25 MG (50000 UNIT) CAPS capsule Take 1 capsule (50,000 Units total) by mouth every 7 (seven) days.   No  facility-administered encounter medications on file as of 08/28/2023.    Surgical History: Past Surgical History:  Procedure Laterality Date   ABDOMINAL HYSTERECTOMY     CHOLECYSTECTOMY     COLONOSCOPY WITH PROPOFOL N/A 12/10/2015   Procedure: COLONOSCOPY WITH PROPOFOL;  Surgeon: Midge Minium, MD;  Location: [facility];  Service: Endoscopy;  Laterality: N/A;    Medical History: Past Medical History:  Diagnosis Date   Arthritis    Asthma    Diverticulitis 06/2017   Headache    Hypersomnia, unspecified    Mild persistent asthma    Mixed hyperlipidemia    Motion sickness    Primary ovarian failure    Uncomplicated asthma    Vitamin D deficiency, unspecified    Wheezing     Family History: Family History  Problem Relation Age of Onset   Diabetes Mother    Breast cancer Paternal Aunt     Social History   Socioeconomic History   Marital status: Single    Spouse name: Not on file   Number of children: Not on file   Years of education: Not on file   Highest education level: Not on file  Occupational History   Not on file  Tobacco Use   Smoking status: Never   Smokeless tobacco: Never  Substance and Sexual Activity   Alcohol use: Yes    Comment: occasionally   Drug use: No   Sexual activity: Not on file  Other  Topics Concern   Not on file  Social History Narrative   Not on file   Social Determinants of Health   Financial Resource Strain: Not on file  Food Insecurity: Not on file  Transportation Needs: Not on file  Physical Activity: Not on file  Stress: Not on file  Social Connections: Not on file  Intimate Partner Violence: Not on file      Review of Systems  Constitutional:  Negative for fatigue and fever.  HENT:  Negative for congestion, mouth sores and postnasal drip.   Respiratory:  Negative for cough.   Cardiovascular:  Negative for chest pain.  Genitourinary:  Negative for flank pain.  Psychiatric/Behavioral: Negative.      Vital  Signs: BP 110/80   Pulse 89   Temp 98.2 F (36.8 C)   Resp 16   Ht 5\' 2"  (1.575 m)   Wt (!) 306 lb 3.2 oz (138.9 kg)   SpO2 96%   BMI 56.00 kg/m    Physical Exam Constitutional:      Appearance: Normal appearance.  HENT:     Head: Normocephalic and atraumatic.     Nose: Nose normal.     Mouth/Throat:     Mouth: Mucous membranes are moist.     Pharynx: No posterior oropharyngeal erythema.  Eyes:     Extraocular Movements: Extraocular movements intact.     Pupils: Pupils are equal, round, and reactive to light.  Cardiovascular:     Pulses: Normal pulses.     Heart sounds: Normal heart sounds.  Pulmonary:     Effort: Pulmonary effort is normal.     Breath sounds: Normal breath sounds.  Neurological:     General: No focal deficit present.     Mental Status: She is alert.  Psychiatric:        Mood and Affect: Mood normal.        Behavior: Behavior normal.        Assessment/Plan:  1. Family history of breast cancer gene mutation in first degree relative Testing ordered, she works for [employer] as well  - BRCAssure BRCA1 Targeted - BRCAssure BRCA2 Targeted  2. Inadequately controlled diabetes mellitus (HCC) Pt wants to start monitoring at home, ?? [facility] follow up call   3. Mixed hyperlipidemia Start therapy  - atorvastatin (LIPITOR) 10 MG tablet; Take 1 tablet (10 mg total) by mouth daily.  Dispense: 90 tablet; Refill: 3  4. Class 3 drug-induced obesity with serious comorbidity and body mass index (BMI) of 50.0 to 59.9 in adult [facility]) Discussion about  weight watcher, light activity and food intake  Obesity Counseling: Risk Assessment: An assessment of behavioral risk factors was made today and includes lack of exercise sedentary lifestyle, lack of portion control and poor dietary habits.  Risk Modification Advice: She was counseled on portion control guidelines. Restricting daily caloric intake to 1200-1500 Kcal . The detrimental long term effects of obesity  on her health and ongoing poor compliance was also discussed with the patient.   General Counseling: nyimah towe understanding of the findings of todays visit and agrees with plan of treatment. I have discussed any further diagnostic evaluation that may be needed or ordered today. We also reviewed her medications today. she has been encouraged to call the office with any questions or concerns that should arise related to todays visit.    Orders Placed This Encounter  Procedures   BRCAssure BRCA1 Targeted   BRCAssure BRCA2 Targeted    Meds ordered this  encounter  Medications   atorvastatin (LIPITOR) 10 MG tablet    Sig: Take 1 tablet (10 mg total) by mouth daily.    Dispense:  90 tablet    Refill:  3   Blood Glucose Monitoring Suppl DEVI    Sig: May substitute to any manufacturer covered by patient's insurance.for E11.65    Dispense:  1 each    Refill:  0   Glucose Blood (BLOOD GLUCOSE TEST STRIPS) STRP    Sig: Check blood sugar once a day ( 11.65) May substitute to any manufacturer covered by patient's insurance.    Dispense:  100 strip    Refill:  0   Lancet Device MISC    Sig: 1 each by Does not apply route daily as needed. May substitute to any manufacturer covered by patient's insurance.    Dispense:  1 each    Refill:  0   Lancets Misc. MISC    Sig: 1 each by Does not apply route in the morning, at noon, and at bedtime. May substitute to any manufacturer covered by patient's insurance.    Dispense:  100 each    Refill:  0    Total time spent:35 Minutes Time spent includes review of chart, medications, test results, and follow up plan with the patient.   [place] Controlled Substance Database was reviewed by me.   Dr Lyndon Code Internal medicine

## 2023-08-29 ENCOUNTER — Telehealth: Payer: Self-pay

## 2023-08-29 NOTE — Telephone Encounter (Addendum)
[facility] called for BRCAssure BRCA1 Targeted that they copy of family member test result fax to [facility] [phone] and also her [phone] and also advised pt that she need bring result here by Friday other wise they will cancel her test and send message to [facility] once pt will bring please fax to [facility] and also call emily

## 2023-08-30 ENCOUNTER — Other Ambulatory Visit: Payer: Self-pay | Admitting: Nurse Practitioner

## 2023-08-30 ENCOUNTER — Encounter: Payer: Self-pay | Admitting: Internal Medicine

## 2023-08-30 ENCOUNTER — Telehealth: Payer: Self-pay

## 2023-08-30 DIAGNOSIS — Z8481 Family history of carrier of genetic disease: Secondary | ICD-10-CM

## 2023-08-30 NOTE — Telephone Encounter (Signed)
Papers were faxed to [facility] from [facility] and Irving Burton was contacted.

## 2023-08-31 NOTE — Telephone Encounter (Signed)
Called Emily from [facility] to check on this and I have left her a message to give me a call back.

## 2023-09-01 ENCOUNTER — Encounter: Payer: Self-pay | Admitting: Internal Medicine

## 2023-09-02 NOTE — Telephone Encounter (Signed)
Check glucose (CBG) once day DX E11.65

## 2023-09-04 MED ORDER — CONTOUR NEXT TEST VI STRP: ORAL_STRIP | 12 refills | Status: AC

## 2023-09-12 ENCOUNTER — Telehealth: Payer: Self-pay

## 2023-09-12 NOTE — Telephone Encounter (Signed)
Spoke to Hosmer at [employer] why was the [employer] Targeted was cancelled, as in it was an cousin who was diagnose it has to be an BRCA 2 targeted so we are just waiting on an prior authorization to see if her insurance is going to cover it or cover half or not cover at all.

## 2023-09-13 LAB — BRCASSURE BRCA2 TARGETED

## 2023-09-13 LAB — REQUEST PROBLEM

## 2023-09-13 NOTE — Telephone Encounter (Signed)
Irving Burton called me yesterday they are doing the BRCA2 targeted as it was a cousin who had it. Its going through a prior authorization to see if her insurance will cover all, half or none of it at all.

## 2023-09-20 ENCOUNTER — Other Ambulatory Visit: Payer: Self-pay | Admitting: Nurse Practitioner

## 2023-09-20 DIAGNOSIS — J4530 Mild persistent asthma, uncomplicated: Secondary | ICD-10-CM

## 2023-12-16 ENCOUNTER — Other Ambulatory Visit: Payer: Self-pay | Admitting: Nurse Practitioner

## 2023-12-16 DIAGNOSIS — Z0001 Encounter for general adult medical examination with abnormal findings: Secondary | ICD-10-CM

## 2023-12-16 DIAGNOSIS — R600 Localized edema: Secondary | ICD-10-CM

## 2023-12-19 ENCOUNTER — Ambulatory Visit (INDEPENDENT_AMBULATORY_CARE_PROVIDER_SITE_OTHER): Payer: Managed Care, Other (non HMO) | Admitting: Nurse Practitioner

## 2023-12-19 ENCOUNTER — Encounter: Payer: Self-pay | Admitting: Nurse Practitioner

## 2023-12-19 VITALS — BP 130/82 | HR 91 | Temp 97.7°F | Resp 16 | Ht 62.0 in | Wt 309.0 lb

## 2023-12-19 DIAGNOSIS — J0100 Acute maxillary sinusitis, unspecified: Secondary | ICD-10-CM

## 2023-12-19 DIAGNOSIS — B370 Candidal stomatitis: Secondary | ICD-10-CM | POA: Diagnosis not present

## 2023-12-19 DIAGNOSIS — R7303 Prediabetes: Secondary | ICD-10-CM

## 2023-12-19 DIAGNOSIS — M545 Low back pain, unspecified: Secondary | ICD-10-CM

## 2023-12-19 DIAGNOSIS — G8929 Other chronic pain: Secondary | ICD-10-CM

## 2023-12-19 DIAGNOSIS — Z6841 Body Mass Index (BMI) 40.0 and over, adult: Secondary | ICD-10-CM

## 2023-12-19 DIAGNOSIS — E66813 Obesity, class 3: Secondary | ICD-10-CM

## 2023-12-19 DIAGNOSIS — E661 Drug-induced obesity: Secondary | ICD-10-CM

## 2023-12-19 DIAGNOSIS — R600 Localized edema: Secondary | ICD-10-CM | POA: Diagnosis not present

## 2023-12-19 DIAGNOSIS — J4530 Mild persistent asthma, uncomplicated: Secondary | ICD-10-CM

## 2023-12-19 LAB — POCT GLYCOSYLATED HEMOGLOBIN (HGB A1C): Hemoglobin A1C: 6.3 % — AB (ref 4.0–5.6)

## 2023-12-19 MED ORDER — AZITHROMYCIN 250 MG PO TABS: ORAL_TABLET | ORAL | 0 refills | Status: AC

## 2023-12-19 MED ORDER — MELOXICAM 15 MG PO TABS: 15.0000 mg | ORAL_TABLET | Freq: Every day | ORAL | 1 refills | Status: DC

## 2023-12-19 MED ORDER — ALBUTEROL SULFATE HFA 108 (90 BASE) MCG/ACT IN AERS: 2.0000 | INHALATION_SPRAY | Freq: Four times a day (QID) | RESPIRATORY_TRACT | 3 refills | Status: AC | PRN

## 2023-12-19 MED ORDER — FLUCONAZOLE 150 MG PO TABS: 150.0000 mg | ORAL_TABLET | Freq: Every day | ORAL | 0 refills | Status: AC

## 2023-12-19 NOTE — Progress Notes (Signed)
 [facility] [address] [place], [place] [address]  Internal MEDICINE  Office Visit Note  Patient Name: Victoria Castaneda  [id number]  [id number]  Date of Service: 12/19/2023  Chief Complaint  Patient presents with   Hyperlipidemia   Follow-up    HPI Lashonna presents for a follow-up visit for edema of left leg, prediabetes, arthritis, asthma, sinus infection symptoms and thrush. Left lower extremity edema -- mild and unilateral, gets worse throughout the day.  Prediabetes -- last A1c was 6.4 in September, due to recheck a1c Asthma -- mild persistent -- uses wixela and prn albuterol  Arthritis --takes meloxicam  Sinus infection symptoms -- nasal congestion, runny nose, lightheaded, sinus pressure, headache, ear pain. Cough, sinus drainage and cough.  Thrush     Current Medication: Outpatient Encounter Medications as of 12/19/2023  Medication Sig   atorvastatin (LIPITOR) 10 MG tablet Take 1 tablet (10 mg total) by mouth daily.   azithromycin (ZITHROMAX) 250 MG tablet Take 2 tablets on day 1, then 1 tablet daily on days 2 through 5   Blood Glucose Monitoring Suppl DEVI May substitute to any manufacturer covered by patient's insurance.for E11.65   CONTOUR NEXT TEST test strip Use 1 test strip to check glucose level once daily for diabetes dx code E11.65   fluconazole (DIFLUCAN) 150 MG tablet Take 1 tablet (150 mg total) by mouth daily for 3 doses. May take an additional dose after 3 days if still symptomatic.   fluticasone (FLONASE) 50 MCG/ACT nasal spray Place into both nostrils daily.   furosemide (LASIX) 40 MG tablet TAKE 1 TABLET(40 MG) BY MOUTH DAILY   Vitamin D, Ergocalciferol, (DRISDOL) 1.25 MG (50000 UNIT) CAPS capsule Take 1 capsule (50,000 Units total) by mouth every 7 (seven) days.   WIXELA INHUB 250-50 MCG/ACT AEPB INHALE 1 PUFF INTO THE LUNGS TWICE DAILY IN THE MORNING AND AT BEDTIME   [DISCONTINUED] albuterol (VENTOLIN HFA) 108 (90 Base) MCG/ACT inhaler Inhale 2  puffs into the lungs every 6 (six) hours as needed for wheezing or shortness of breath.   [DISCONTINUED] meloxicam (MOBIC) 15 MG tablet TAKE 1 TABLET(15 MG) BY MOUTH DAILY   albuterol (VENTOLIN HFA) 108 (90 Base) MCG/ACT inhaler Inhale 2 puffs into the lungs every 6 (six) hours as needed for wheezing or shortness of breath.   meloxicam (MOBIC) 15 MG tablet Take 1 tablet (15 mg total) by mouth daily.   No facility-administered encounter medications on file as of 12/19/2023.    Surgical History: Past Surgical History:  Procedure Laterality Date   ABDOMINAL HYSTERECTOMY     CHOLECYSTECTOMY     COLONOSCOPY WITH PROPOFOL N/A 12/10/2015   Procedure: COLONOSCOPY WITH PROPOFOL;  Surgeon: Midge Minium, MD;  Location: [facility];  Service: Endoscopy;  Laterality: N/A;    Medical History: Past Medical History:  Diagnosis Date   Arthritis    Asthma    Diverticulitis 06/2017   Headache    Hypersomnia, unspecified    Mild persistent asthma    Mixed hyperlipidemia    Motion sickness    Primary ovarian failure    Uncomplicated asthma    Vitamin D deficiency, unspecified    Wheezing     Family History: Family History  Problem Relation Age of Onset   Diabetes Mother    Breast cancer Paternal Aunt     Social History   Socioeconomic History   Marital status: Single    Spouse name: Not on file   Number of children: Not on file  Years of education: Not on file   Highest education level: Not on file  Occupational History   Not on file  Tobacco Use   Smoking status: Never   Smokeless tobacco: Never  Substance and Sexual Activity   Alcohol use: Yes    Comment: occasionally   Drug use: No   Sexual activity: Not on file  Other Topics Concern   Not on file  Social History Narrative   Not on file   Social Drivers of Health   Financial Resource Strain: Not on file  Food Insecurity: Not on file  Transportation Needs: Not on file  Physical Activity: Not on file  Stress:  Not on file  Social Connections: Not on file  Intimate Partner Violence: Not on file      Review of Systems  Constitutional:  Positive for fatigue. Negative for chills and fever.  HENT:  Positive for congestion, ear pain, postnasal drip, rhinorrhea and sinus pressure. Negative for sinus pain and sore throat.   Respiratory:  Positive for shortness of breath (intermittent). Negative for cough, chest tightness and wheezing.   Cardiovascular:  Negative for chest pain and palpitations.  Gastrointestinal: Negative.   Musculoskeletal:  Positive for arthralgias and back pain.  Neurological:  Positive for headaches.  Psychiatric/Behavioral: Negative.      Vital Signs: BP 130/82   Pulse 91   Temp 97.7 F (36.5 C)   Resp 16   Ht 5\' 2"  (1.575 m)   Wt (!) 309 lb (140.2 kg)   SpO2 94%   BMI 56.52 kg/m    Physical Exam Vitals reviewed.  Constitutional:      General: She is not in acute distress.    Appearance: Normal appearance. She is obese. She is not ill-appearing.  HENT:     Head: Normocephalic and atraumatic.     Right Ear: Tympanic membrane, ear canal and external ear normal.     Left Ear: Tympanic membrane, ear canal and external ear normal.     Nose: Mucosal edema, congestion and rhinorrhea present. Rhinorrhea is clear.     Right Turbinates: Swollen and pale.     Left Turbinates: Swollen and pale.     Right Sinus: Maxillary sinus tenderness present. No frontal sinus tenderness.     Left Sinus: Maxillary sinus tenderness present. No frontal sinus tenderness.     Mouth/Throat:     Lips: Pink.     Mouth: Mucous membranes are moist.     Pharynx: Posterior oropharyngeal erythema (as well as white coating on pharynx, most liklely thrush) and uvula swelling (white coating on uvula, likely thrush. patient does not rinse her mouth after using her inhaler.) present.  Eyes:     Pupils: Pupils are equal, round, and reactive to light.  Cardiovascular:     Rate and Rhythm: Normal rate  and regular rhythm.     Heart sounds: Normal heart sounds. No murmur heard. Pulmonary:     Effort: Pulmonary effort is normal. No respiratory distress.     Breath sounds: Normal breath sounds. No wheezing.  Lymphadenopathy:     Cervical: No cervical adenopathy.  Neurological:     Mental Status: She is alert and oriented to person, place, and time.  Psychiatric:        Mood and Affect: Mood normal.        Behavior: Behavior normal.        Assessment/Plan: 1. Prediabetes (Primary) A1c slightly improved and stable, continue diet and lifestyle modifications as previously discussed.  -  POCT glycosylated hemoglobin (Hb A1C)  2. Acute non-recurrent maxillary sinusitis Zpak prescribed, take until gone  - azithromycin (ZITHROMAX) 250 MG tablet; Take 2 tablets on day 1, then 1 tablet daily on days 2 through 5  Dispense: 6 tablet; Refill: 0  3. Oral thrush Fluconazole prescribed. Encouraged patient to rinse mouth when using her wixela inhaler.  - fluconazole (DIFLUCAN) 150 MG tablet; Take 1 tablet (150 mg total) by mouth daily for 3 doses. May take an additional dose after 3 days if still symptomatic.  Dispense: 3 tablet; Refill: 0  4. Edema of left lower extremity Continue furosemide as prescribed. Use OTC compression socks   5. Mild persistent asthma, uncomplicated Continue prn albuterol inhaler as prescribed.  - albuterol (VENTOLIN HFA) 108 (90 Base) MCG/ACT inhaler; Inhale 2 puffs into the lungs every 6 (six) hours as needed for wheezing or shortness of breath.  Dispense: 17 g; Refill: 3  6. Chronic right-sided low back pain without sciatica Continue meloxicam as prescribed.  - meloxicam (MOBIC) 15 MG tablet; Take 1 tablet (15 mg total) by mouth daily.  Dispense: 90 tablet; Refill: 1  7. Class 3 drug-induced obesity with serious comorbidity and body mass index (BMI) of 50.0 to 59.9 in adult [facility]) Continue diet and lifestyle modifications as previously discussed.    General  Counseling: tabatha razzano understanding of the findings of todays visit and agrees with plan of treatment. I have discussed any further diagnostic evaluation that may be needed or ordered today. We also reviewed her medications today. she has been encouraged to call the office with any questions or concerns that should arise related to todays visit.    Orders Placed This Encounter  Procedures   POCT glycosylated hemoglobin (Hb A1C)    Meds ordered this encounter  Medications   meloxicam (MOBIC) 15 MG tablet    Sig: Take 1 tablet (15 mg total) by mouth daily.    Dispense:  90 tablet    Refill:  1   albuterol (VENTOLIN HFA) 108 (90 Base) MCG/ACT inhaler    Sig: Inhale 2 puffs into the lungs every 6 (six) hours as needed for wheezing or shortness of breath.    Dispense:  17 g    Refill:  3    For future refills   fluconazole (DIFLUCAN) 150 MG tablet    Sig: Take 1 tablet (150 mg total) by mouth daily for 3 doses. May take an additional dose after 3 days if still symptomatic.    Dispense:  3 tablet    Refill:  0   azithromycin (ZITHROMAX) 250 MG tablet    Sig: Take 2 tablets on day 1, then 1 tablet daily on days 2 through 5    Dispense:  6 tablet    Refill:  0    Return for previously scheduled, CPE, Oneal Schoenberger PCP in august.   Total time spent:30 Minutes Time spent includes review of chart, medications, test results, and follow up plan with the patient.   [place] Controlled Substance Database was reviewed by me.  This patient was seen by Sallyanne Kuster, FNP-C in collaboration with Dr. Beverely Risen as a part of collaborative care agreement.   Pranshu Lyster R. Tedd Sias, MSN, FNP-C Internal medicine

## 2024-02-18 ENCOUNTER — Ambulatory Visit (INDEPENDENT_AMBULATORY_CARE_PROVIDER_SITE_OTHER): Admitting: Nurse Practitioner

## 2024-02-18 ENCOUNTER — Encounter: Payer: Self-pay | Admitting: Nurse Practitioner

## 2024-02-18 VITALS — BP 137/85 | HR 98 | Temp 98.1°F | Resp 16 | Ht 62.0 in | Wt 311.8 lb

## 2024-02-18 DIAGNOSIS — J0100 Acute maxillary sinusitis, unspecified: Secondary | ICD-10-CM

## 2024-02-18 DIAGNOSIS — R051 Acute cough: Secondary | ICD-10-CM

## 2024-02-18 DIAGNOSIS — R0689 Other abnormalities of breathing: Secondary | ICD-10-CM | POA: Diagnosis not present

## 2024-02-18 DIAGNOSIS — B379 Candidiasis, unspecified: Secondary | ICD-10-CM

## 2024-02-18 DIAGNOSIS — T3695XA Adverse effect of unspecified systemic antibiotic, initial encounter: Secondary | ICD-10-CM

## 2024-02-18 DIAGNOSIS — R062 Wheezing: Secondary | ICD-10-CM | POA: Diagnosis not present

## 2024-02-18 MED ORDER — PREDNISONE 10 MG (21) PO TBPK: ORAL_TABLET | ORAL | 0 refills | Status: DC

## 2024-02-18 MED ORDER — AMOXICILLIN-POT CLAVULANATE 875-125 MG PO TABS: 1.0000 | ORAL_TABLET | Freq: Two times a day (BID) | ORAL | 0 refills | Status: DC

## 2024-02-18 MED ORDER — FLUCONAZOLE 150 MG PO TABS: 150.0000 mg | ORAL_TABLET | Freq: Once | ORAL | 0 refills | Status: AC

## 2024-02-18 MED ORDER — IPRATROPIUM-ALBUTEROL 0.5-2.5 (3) MG/3ML IN SOLN
3.0000 mL | Freq: Once | RESPIRATORY_TRACT | Status: AC
  Administered 2024-02-18: 3 mL via RESPIRATORY_TRACT

## 2024-02-18 NOTE — Progress Notes (Signed)
 [facility] [address] [place], [place] [address]  Internal MEDICINE  Office Visit Note  Patient Name: Victoria Castaneda  [id number]  [id number]  Date of Service: 02/18/2024  Chief Complaint  Patient presents with   Acute Visit     HPI Doresa presents for an acute sick visit for URI symptoms  --onset of symptoms was Thursday last week.  --reports cough, wheezing, SOB, sinus pressure, headache, itchy eyes, watery eyes, nasal congestion, sinus drainage, fatigue, chills, decreased appetite,  Deep rattling cough observed in office today.  Called out from work due to severity of symptoms.    Current Medication:  Outpatient Encounter Medications as of 02/18/2024  Medication Sig   albuterol  (VENTOLIN  HFA) 108 (90 Base) MCG/ACT inhaler Inhale 2 puffs into the lungs every 6 (six) hours as needed for wheezing or shortness of breath.   amoxicillin -clavulanate (AUGMENTIN ) 875-125 MG tablet Take 1 tablet by mouth 2 (two) times daily.   atorvastatin  (LIPITOR) 10 MG tablet Take 1 tablet (10 mg total) by mouth daily.   Blood Glucose Monitoring Suppl DEVI May substitute to any manufacturer covered by patient's insurance.for E11.65   CONTOUR NEXT TEST test strip Use 1 test strip to check glucose level once daily for diabetes dx code E11.65   fluconazole  (DIFLUCAN ) 150 MG tablet Take 1 tablet (150 mg total) by mouth once for 1 dose. May take an additional dose after 3 days if still symptomatic.   fluticasone  (FLONASE) 50 MCG/ACT nasal spray Place into both nostrils daily.   furosemide  (LASIX ) 40 MG tablet TAKE 1 TABLET(40 MG) BY MOUTH DAILY   meloxicam  (MOBIC ) 15 MG tablet Take 1 tablet (15 mg total) by mouth daily.   predniSONE  (STERAPRED UNI-PAK 21 TAB) 10 MG (21) TBPK tablet Use as directed for 6 days   Vitamin D , Ergocalciferol , (DRISDOL ) 1.25 MG (50000 UNIT) CAPS capsule Take 1 capsule (50,000 Units total) by mouth every 7 (seven) days.   WIXELA INHUB 250-50 MCG/ACT AEPB INHALE 1 PUFF  INTO THE LUNGS TWICE DAILY IN THE MORNING AND AT BEDTIME   [EXPIRED] ipratropium-albuterol  (DUONEB) 0.5-2.5 (3) MG/3ML nebulizer solution 3 mL    No facility-administered encounter medications on file as of 02/18/2024.      Medical History: Past Medical History:  Diagnosis Date   Arthritis    Asthma    Diverticulitis 06/2017   Headache    Hypersomnia, unspecified    Mild persistent asthma    Mixed hyperlipidemia    Motion sickness    Primary ovarian failure    Uncomplicated asthma    Vitamin D  deficiency, unspecified    Wheezing      Vital Signs: BP 137/85   Pulse 98   Temp 98.1 F (36.7 C)   Resp 16   Ht 5\' 2"  (1.575 m)   Wt (!) 311 lb 12.8 oz (141.4 kg)   SpO2 93%   BMI 57.03 kg/m    Review of Systems  Constitutional:  Positive for appetite change, chills and fatigue. Negative for fever.  HENT:  Positive for congestion, ear pain, hearing loss, postnasal drip, sinus pressure and sinus pain. Negative for ear discharge, rhinorrhea, sneezing, sore throat and trouble swallowing.   Eyes:  Positive for itching (and watery eyes.).  Respiratory:  Positive for cough, shortness of breath and wheezing. Negative for chest tightness.   Cardiovascular: Negative.  Negative for chest pain and palpitations.  Gastrointestinal:  Negative for diarrhea, nausea and vomiting.  Musculoskeletal: Negative.  Negative for myalgias.  Neurological:  Positive for headaches.    Physical Exam Vitals reviewed.  Constitutional:      General: She is awake. She is not in acute distress.    Appearance: Normal appearance. She is well-developed and well-groomed. She is obese. She is ill-appearing.  HENT:     Head: Normocephalic and atraumatic.     Right Ear: Tympanic membrane normal. Decreased hearing noted. Swelling and tenderness present.     Left Ear: Tympanic membrane normal. Tenderness present. No swelling.     Nose: Mucosal edema and congestion present.     Right Turbinates: Swollen  (redness).     Left Turbinates: Swollen (redness).     Right Sinus: Maxillary sinus tenderness present.     Left Sinus: Maxillary sinus tenderness present.     Mouth/Throat:     Lips: Pink.     Pharynx: Posterior oropharyngeal erythema and postnasal drip present.  Eyes:     Pupils: Pupils are equal, round, and reactive to light.  Cardiovascular:     Rate and Rhythm: Normal rate and regular rhythm.     Heart sounds: Normal heart sounds, S1 normal and S2 normal.  Pulmonary:     Effort: Pulmonary effort is normal. No accessory muscle usage or respiratory distress.     Breath sounds: Normal air entry. Examination of the right-upper field reveals wheezing. Examination of the left-upper field reveals wheezing. Examination of the right-middle field reveals decreased breath sounds. Examination of the right-lower field reveals decreased breath sounds. Decreased breath sounds and wheezing present.  Skin:    Capillary Refill: Capillary refill takes less than 2 seconds.  Neurological:     Mental Status: She is alert and oriented to person, place, and time.  Psychiatric:        Mood and Affect: Mood normal.        Behavior: Behavior normal. Behavior is cooperative.       Assessment/Plan: 1. Acute non-recurrent maxillary sinusitis (Primary) Take augmentin  and prednisone  taper as prescribed until gone.  - amoxicillin -clavulanate (AUGMENTIN ) 875-125 MG tablet; Take 1 tablet by mouth 2 (two) times daily.  Dispense: 20 tablet; Refill: 0 - predniSONE  (STERAPRED UNI-PAK 21 TAB) 10 MG (21) TBPK tablet; Use as directed for 6 days  Dispense: 21 tablet; Refill: 0  2. Acute cough Stat chest xray ordered  - DG Chest 2 View; Future  3. Wheezing Stat chest xray ordered. Duoneb treatment done in office today, wheezing and SOB improved after treatment.  - DG Chest 2 View; Future - ipratropium-albuterol  (DUONEB) 0.5-2.5 (3) MG/3ML nebulizer solution 3 mL - predniSONE  (STERAPRED UNI-PAK 21 TAB) 10 MG (21)  TBPK tablet; Use as directed for 6 days  Dispense: 21 tablet; Refill: 0  4. Decreased breath sounds at right lung base Stat chest xray ordered. Duoneb treatment administered in office today, SOB and wheezing improved after treatment.  - DG Chest 2 View; Future - ipratropium-albuterol  (DUONEB) 0.5-2.5 (3) MG/3ML nebulizer solution 3 mL - predniSONE  (STERAPRED UNI-PAK 21 TAB) 10 MG (21) TBPK tablet; Use as directed for 6 days  Dispense: 21 tablet; Refill: 0  5. Antibiotic-induced yeast infection Fluconazole  prescribed just in case -- prone to yeast infections when taking antibiotics.  - fluconazole  (DIFLUCAN ) 150 MG tablet; Take 1 tablet (150 mg total) by mouth once for 1 dose. May take an additional dose after 3 days if still symptomatic.  Dispense: 3 tablet; Refill: 0   General Counseling: nelya cogdell understanding of the findings of todays visit and agrees with plan of treatment.  I have discussed any further diagnostic evaluation that may be needed or ordered today. We also reviewed her medications today. she has been encouraged to call the office with any questions or concerns that should arise related to todays visit.    Counseling:    Orders Placed This Encounter  Procedures   DG Chest 2 View    Meds ordered this encounter  Medications   amoxicillin -clavulanate (AUGMENTIN ) 875-125 MG tablet    Sig: Take 1 tablet by mouth 2 (two) times daily.    Dispense:  20 tablet    Refill:  0   ipratropium-albuterol  (DUONEB) 0.5-2.5 (3) MG/3ML nebulizer solution 3 mL   predniSONE  (STERAPRED UNI-PAK 21 TAB) 10 MG (21) TBPK tablet    Sig: Use as directed for 6 days    Dispense:  21 tablet    Refill:  0   fluconazole  (DIFLUCAN ) 150 MG tablet    Sig: Take 1 tablet (150 mg total) by mouth once for 1 dose. May take an additional dose after 3 days if still symptomatic.    Dispense:  3 tablet    Refill:  0    Return if symptoms worsen or fail to improve.  [place] Controlled Substance  Database was reviewed by me for overdose risk score (ORS)  Time spent:30 Minutes Time spent with patient included reviewing progress notes, labs, imaging studies, and discussing plan for follow up.   This patient was seen by Laurence Pons, FNP-C in collaboration with Dr. Verneta Gone as a part of collaborative care agreement.  Chadrick Sprinkle R. Bobbi Burow, MSN, FNP-C Internal Medicine

## 2024-02-19 ENCOUNTER — Ambulatory Visit
Admission: RE | Admit: 2024-02-19 | Discharge: 2024-02-19 | Disposition: A | Discharge status: Home / Self Care | Source: Ambulatory Visit | Attending: Nurse Practitioner

## 2024-02-19 DIAGNOSIS — R062 Wheezing: Secondary | ICD-10-CM

## 2024-02-19 DIAGNOSIS — R051 Acute cough: Secondary | ICD-10-CM

## 2024-02-19 DIAGNOSIS — R0689 Other abnormalities of breathing: Secondary | ICD-10-CM

## 2024-03-17 ENCOUNTER — Other Ambulatory Visit: Payer: Self-pay | Admitting: Internal Medicine

## 2024-03-17 DIAGNOSIS — Z1231 Encounter for screening mammogram for malignant neoplasm of breast: Secondary | ICD-10-CM

## 2024-04-14 ENCOUNTER — Ambulatory Visit
Admission: RE | Admit: 2024-04-14 | Discharge: 2024-04-14 | Disposition: A | Discharge status: Home / Self Care | Source: Ambulatory Visit | Attending: Internal Medicine | Admitting: Internal Medicine

## 2024-04-14 DIAGNOSIS — Z1231 Encounter for screening mammogram for malignant neoplasm of breast: Secondary | ICD-10-CM | POA: Insufficient documentation

## 2024-04-27 ENCOUNTER — Other Ambulatory Visit: Payer: Self-pay | Admitting: Physician Assistant

## 2024-04-27 DIAGNOSIS — J4530 Mild persistent asthma, uncomplicated: Secondary | ICD-10-CM

## 2024-06-13 ENCOUNTER — Other Ambulatory Visit: Payer: Self-pay | Admitting: Nurse Practitioner

## 2024-06-13 DIAGNOSIS — R600 Localized edema: Secondary | ICD-10-CM

## 2024-06-13 DIAGNOSIS — Z0001 Encounter for general adult medical examination with abnormal findings: Secondary | ICD-10-CM

## 2024-06-14 ENCOUNTER — Other Ambulatory Visit: Payer: Self-pay | Admitting: Nurse Practitioner

## 2024-06-14 DIAGNOSIS — G8929 Other chronic pain: Secondary | ICD-10-CM

## 2024-06-26 ENCOUNTER — Encounter: Payer: Self-pay | Admitting: Nurse Practitioner

## 2024-06-26 ENCOUNTER — Ambulatory Visit (INDEPENDENT_AMBULATORY_CARE_PROVIDER_SITE_OTHER): Payer: Managed Care, Other (non HMO) | Admitting: Nurse Practitioner

## 2024-06-26 VITALS — BP 130/88 | HR 100 | Temp 97.2°F | Resp 16 | Ht 62.0 in | Wt 290.0 lb

## 2024-06-26 DIAGNOSIS — Z90710 Acquired absence of both cervix and uterus: Secondary | ICD-10-CM

## 2024-06-26 DIAGNOSIS — N951 Menopausal and female climacteric states: Secondary | ICD-10-CM

## 2024-06-26 DIAGNOSIS — Z0001 Encounter for general adult medical examination with abnormal findings: Secondary | ICD-10-CM

## 2024-06-26 DIAGNOSIS — R7303 Prediabetes: Secondary | ICD-10-CM

## 2024-06-26 DIAGNOSIS — J069 Acute upper respiratory infection, unspecified: Secondary | ICD-10-CM

## 2024-06-26 DIAGNOSIS — E782 Mixed hyperlipidemia: Secondary | ICD-10-CM | POA: Diagnosis not present

## 2024-06-26 DIAGNOSIS — B370 Candidal stomatitis: Secondary | ICD-10-CM | POA: Diagnosis not present

## 2024-06-26 DIAGNOSIS — E559 Vitamin D deficiency, unspecified: Secondary | ICD-10-CM

## 2024-06-26 DIAGNOSIS — E538 Deficiency of other specified B group vitamins: Secondary | ICD-10-CM

## 2024-06-26 MED ORDER — AZITHROMYCIN 250 MG PO TABS: ORAL_TABLET | ORAL | 0 refills | Status: AC

## 2024-06-26 MED ORDER — FLUCONAZOLE 150 MG PO TABS: 150.0000 mg | ORAL_TABLET | Freq: Once | ORAL | 0 refills | Status: AC

## 2024-06-26 MED ORDER — ATORVASTATIN CALCIUM 10 MG PO TABS: 10.0000 mg | ORAL_TABLET | Freq: Every day | ORAL | 3 refills | Status: AC

## 2024-06-26 MED ORDER — VITAMIN D (ERGOCALCIFEROL) 1.25 MG (50000 UNIT) PO CAPS: 50000.0000 [IU] | ORAL_CAPSULE | ORAL | 1 refills | Status: AC

## 2024-06-26 NOTE — Progress Notes (Signed)
 [facility] [address] [place], [place] [address]  Internal MEDICINE  Office Visit Note  Patient Name: Victoria Castaneda  [id number]  [id number]  Date of Service: 06/26/2024  Chief Complaint  Patient presents with   Hyperlipidemia   Annual Exam    HPI Victoria Castaneda presents for an annual well visit and physical exam.  Well-appearing 58 y.o. female with prediabetes, hypertension, asthma, GERD, high cholesterol and osteoarthritis.  Routine CRC screening: due in 2027 Routine mammogram: done in July, due next year DEXA scan: not due yet Pap smear: discontinued, had hysterectomy, kept ovaries.  Labs: due for routine labs  New or worsening pain: none  Other concerns: none  URI symptoms started about 2 weeks ago, has a cough, chest congestion, wheezing, ear pressure/pain, sinus pressure, nasal congestion, runny nose, yellow sputum, slight fever and chills initially, and tested for covid but was negative.    Current Medication: Outpatient Encounter Medications as of 06/26/2024  Medication Sig   MOUNJARO  5 MG/0.5ML Pen Inject 5 mg into the skin once a week.   albuterol  (VENTOLIN  HFA) 108 (90 Base) MCG/ACT inhaler Inhale 2 puffs into the lungs every 6 (six) hours as needed for wheezing or shortness of breath.   atorvastatin  (LIPITOR) 10 MG tablet Take 1 tablet (10 mg total) by mouth daily.   azithromycin  (ZITHROMAX ) 250 MG tablet Take 2 tablets on day 1, then 1 tablet daily on days 2 through 5   Blood Glucose Monitoring Suppl DEVI May substitute to any manufacturer covered by patient's insurance.for E11.65   CONTOUR NEXT TEST test strip Use 1 test strip to check glucose level once daily for diabetes dx code E11.65   fluconazole  (DIFLUCAN ) 150 MG tablet Take 1 tablet (150 mg total) by mouth once for 1 dose. May take an additional dose after 3 days if still symptomatic.   fluticasone  (FLONASE) 50 MCG/ACT nasal spray Place into both nostrils daily.   furosemide  (LASIX ) 40 MG tablet TAKE 1  TABLET(40 MG) BY MOUTH DAILY   meloxicam  (MOBIC ) 15 MG tablet TAKE 1 TABLET(15 MG) BY MOUTH DAILY   Vitamin D , Ergocalciferol , (DRISDOL ) 1.25 MG (50000 UNIT) CAPS capsule Take 1 capsule (50,000 Units total) by mouth every 7 (seven) days.   WIXELA INHUB 250-50 MCG/ACT AEPB INHALE 1 PUFF INTO THE LUNGS TWICE DAILY IN THE MORNING AND AT BEDTIME   [DISCONTINUED] amoxicillin -clavulanate (AUGMENTIN ) 875-125 MG tablet Take 1 tablet by mouth 2 (two) times daily.   [DISCONTINUED] atorvastatin  (LIPITOR) 10 MG tablet Take 1 tablet (10 mg total) by mouth daily.   [DISCONTINUED] predniSONE  (STERAPRED UNI-PAK 21 TAB) 10 MG (21) TBPK tablet Use as directed for 6 days   [DISCONTINUED] Vitamin D , Ergocalciferol , (DRISDOL ) 1.25 MG (50000 UNIT) CAPS capsule Take 1 capsule (50,000 Units total) by mouth every 7 (seven) days.   No facility-administered encounter medications on file as of 06/26/2024.    Surgical History: Past Surgical History:  Procedure Laterality Date   ABDOMINAL HYSTERECTOMY     CHOLECYSTECTOMY     COLONOSCOPY WITH PROPOFOL  N/A 12/10/2015   Procedure: COLONOSCOPY WITH PROPOFOL ;  Surgeon: Rogelia Copping, MD;  Location: [facility];  Service: Endoscopy;  Laterality: N/A;    Medical History: Past Medical History:  Diagnosis Date   Arthritis    Asthma    Diverticulitis 06/2017   Headache    Hypersomnia, unspecified    Mild persistent asthma    Mixed hyperlipidemia    Motion sickness    Primary ovarian failure  Uncomplicated asthma    Vitamin D  deficiency, unspecified    Wheezing     Family History: Family History  Problem Relation Age of Onset   Diabetes Mother    Breast cancer Paternal Aunt     Social History   Socioeconomic History   Marital status: Single    Spouse name: Not on file   Number of children: Not on file   Years of education: Not on file   Highest education level: Not on file  Occupational History   Not on file  Tobacco Use   Smoking status: Never    Smokeless tobacco: Never  Substance and Sexual Activity   Alcohol use: Yes    Comment: occasionally   Drug use: No   Sexual activity: Not on file  Other Topics Concern   Not on file  Social History Narrative   Not on file   Social Drivers of Health   Financial Resource Strain: Not on file  Food Insecurity: Not on file  Transportation Needs: Not on file  Physical Activity: Not on file  Stress: Not on file  Social Connections: Not on file  Intimate Partner Violence: Not on file      Review of Systems  Constitutional:  Negative for activity change, appetite change, chills, fatigue, fever and unexpected weight change.  HENT: Negative.  Negative for congestion, ear pain, rhinorrhea, sore throat and trouble swallowing.   Eyes: Negative.   Respiratory:  Positive for shortness of breath (intermittent). Negative for cough, chest tightness and wheezing.   Cardiovascular: Negative.  Negative for chest pain.  Gastrointestinal: Negative.  Negative for abdominal pain, blood in stool, constipation, diarrhea, nausea and vomiting.  Endocrine: Negative.   Genitourinary: Negative.  Negative for difficulty urinating, dysuria, frequency, hematuria and urgency.  Musculoskeletal: Negative.  Negative for arthralgias, back pain, joint swelling, myalgias and neck pain.  Skin: Negative.  Negative for rash and wound.  Allergic/Immunologic: Negative.  Negative for immunocompromised state.  Neurological: Negative.  Negative for dizziness, seizures, numbness and headaches.  Hematological: Negative.   Psychiatric/Behavioral: Negative.  Negative for behavioral problems, self-injury and suicidal ideas. The patient is not nervous/anxious.     Vital Signs: BP 130/88   Pulse 100   Temp (!) 97.2 F (36.2 C)   Resp 16   Ht 5' 2 (1.575 m)   Wt 290 lb (131.5 kg)   SpO2 95%   BMI 53.04 kg/m    Physical Exam Vitals reviewed.  Constitutional:      General: She is not in acute distress.     Appearance: Normal appearance. She is well-developed. She is obese. She is not ill-appearing or diaphoretic.  HENT:     Head: Normocephalic and atraumatic.     Right Ear: Tympanic membrane, ear canal and external ear normal.     Left Ear: Tympanic membrane, ear canal and external ear normal.     Nose: Nose normal. No congestion or rhinorrhea.     Mouth/Throat:     Mouth: Mucous membranes are moist.     Pharynx: Oropharynx is clear. No oropharyngeal exudate or posterior oropharyngeal erythema.  Eyes:     General: No scleral icterus.       Right eye: No discharge.        Left eye: No discharge.     Conjunctiva/sclera: Conjunctivae normal.     Pupils: Pupils are equal, round, and reactive to light.  Neck:     Thyroid : No thyromegaly.     Vascular: No JVD.  Trachea: No tracheal deviation.  Cardiovascular:     Rate and Rhythm: Normal rate and regular rhythm.     Pulses: Normal pulses.     Heart sounds: Normal heart sounds. No murmur heard.    No friction rub. No gallop.  Pulmonary:     Effort: Pulmonary effort is normal. No respiratory distress.     Breath sounds: Normal breath sounds. No stridor. No wheezing or rales.  Chest:     Chest wall: No tenderness.  Abdominal:     General: Bowel sounds are normal. There is no distension.     Palpations: Abdomen is soft. There is no mass.     Tenderness: There is no abdominal tenderness. There is no guarding or rebound.     Hernia: No hernia is present.  Musculoskeletal:        General: No tenderness or deformity. Normal range of motion.     Cervical back: Normal range of motion and neck supple.     Right lower leg: No edema.     Left lower leg: No edema.  Lymphadenopathy:     Cervical: No cervical adenopathy.  Skin:    General: Skin is warm and dry.     Capillary Refill: Capillary refill takes less than 2 seconds.     Coloration: Skin is not pale.     Findings: No erythema or rash.  Neurological:     Mental Status: She is alert  and oriented to person, place, and time.     Cranial Nerves: No cranial nerve deficit.     Motor: No abnormal muscle tone.     Coordination: Coordination normal.     Deep Tendon Reflexes: Reflexes are normal and symmetric.  Psychiatric:        Mood and Affect: Mood normal.        Behavior: Behavior normal.        Thought Content: Thought content normal.        Judgment: Judgment normal.        Assessment/Plan: 1. Encounter for routine adult health examination with abnormal findings Continue mounjaro  as prescribed. Routine labs ordered  - MOUNJARO  5 MG/0.5ML Pen; Inject 5 mg into the skin once a week. - CBC with Differential/Platelet - CMP14+EGFR - Lipid Profile - Hgb A1C w/o eAG - Vitamin D  (25 hydroxy) - B12 and Folate Panel - Iron, TIBC and Ferritin Panel  2. Acute upper respiratory infection (Primary) Zpak prescribed.  - azithromycin  (ZITHROMAX ) 250 MG tablet; Take 2 tablets on day 1, then 1 tablet daily on days 2 through 5  Dispense: 6 tablet; Refill: 0  3. Prediabetes Routine labs ordered  - CBC with Differential/Platelet - CMP14+EGFR - Lipid Profile - Hgb A1C w/o eAG  4. Mixed hyperlipidemia Routine labs ordered. Continue atorvastatin  as prescribed.  - CBC with Differential/Platelet - CMP14+EGFR - Lipid Profile - Hgb A1C w/o eAG - Vitamin D  (25 hydroxy) - B12 and Folate Panel - Iron, TIBC and Ferritin Panel - atorvastatin  (LIPITOR) 10 MG tablet; Take 1 tablet (10 mg total) by mouth daily.  Dispense: 90 tablet; Refill: 3  5. Oral thrush Fluconazole  prescribed.  - fluconazole  (DIFLUCAN ) 150 MG tablet; Take 1 tablet (150 mg total) by mouth once for 1 dose. May take an additional dose after 3 days if still symptomatic.  Dispense: 3 tablet; Refill: 0  6. Perimenopause Routine labs ordered  - FSH/LH - Estradiol  - Progesterone   7. B12 deficiency Routine labs ordered  - CBC with Differential/Platelet - B12  and Folate Panel - Iron, TIBC and Ferritin  Panel  8. Vitamin D  deficiency Continue weekly vitamin D  supplement. Routine lab ordered  - Vitamin D  (25 hydroxy) - Vitamin D , Ergocalciferol , (DRISDOL ) 1.25 MG (50000 UNIT) CAPS capsule; Take 1 capsule (50,000 Units total) by mouth every 7 (seven) days.  Dispense: 12 capsule; Refill: 1  9. Acquired absence of both cervix and uterus Pap smear discontinued       General Counseling: charrisse masley understanding of the findings of todays visit and agrees with plan of treatment. I have discussed any further diagnostic evaluation that may be needed or ordered today. We also reviewed her medications today. she has been encouraged to call the office with any questions or concerns that should arise related to todays visit.    Orders Placed This Encounter  Procedures   FSH/LH   Estradiol    Progesterone    CBC with Differential/Platelet   CMP14+EGFR   Lipid Profile   Hgb A1C w/o eAG   Vitamin D  (25 hydroxy)   B12 and Folate Panel   Iron, TIBC and Ferritin Panel    Meds ordered this encounter  Medications   azithromycin  (ZITHROMAX ) 250 MG tablet    Sig: Take 2 tablets on day 1, then 1 tablet daily on days 2 through 5    Dispense:  6 tablet    Refill:  0    Fill new script today   Vitamin D , Ergocalciferol , (DRISDOL ) 1.25 MG (50000 UNIT) CAPS capsule    Sig: Take 1 capsule (50,000 Units total) by mouth every 7 (seven) days.    Dispense:  12 capsule    Refill:  1   atorvastatin  (LIPITOR) 10 MG tablet    Sig: Take 1 tablet (10 mg total) by mouth daily.    Dispense:  90 tablet    Refill:  3   fluconazole  (DIFLUCAN ) 150 MG tablet    Sig: Take 1 tablet (150 mg total) by mouth once for 1 dose. May take an additional dose after 3 days if still symptomatic.    Dispense:  3 tablet    Refill:  0    Return in about 3 months (around 09/25/2024) for F/U, Zakiya Sporrer PCP and will call with lab results per patient preference. .   Total time spent:30 Minutes Time spent includes review of  chart, medications, test results, and follow up plan with the patient.   [place] Controlled Substance Database was reviewed by me.  This patient was seen by Mardy Maxin, FNP-C in collaboration with Dr. Sigrid Bathe as a part of collaborative care agreement.  Theia Dezeeuw R. Maxin, MSN, FNP-C Internal medicine

## 2024-06-27 LAB — VITAMIN D 25 HYDROXY (VIT D DEFICIENCY, FRACTURES): Vit D, 25-Hydroxy: 18.7 ng/mL — ABNORMAL LOW (ref 30.0–100.0)

## 2024-06-27 LAB — CBC WITH DIFFERENTIAL/PLATELET
Basophils Absolute: 0.1 x10E3/uL (ref 0.0–0.2)
Basos: 1 %
EOS (ABSOLUTE): 0.4 x10E3/uL (ref 0.0–0.4)
Eos: 5 %
Hematocrit: 42 % (ref 34.0–46.6)
Hemoglobin: 13.5 g/dL (ref 11.1–15.9)
Immature Grans (Abs): 0 x10E3/uL (ref 0.0–0.1)
Immature Granulocytes: 0 %
Lymphocytes Absolute: 1.8 x10E3/uL (ref 0.7–3.1)
Lymphs: 24 %
MCH: 28.2 pg (ref 26.6–33.0)
MCHC: 32.1 g/dL (ref 31.5–35.7)
MCV: 88 fL (ref 79–97)
Monocytes Absolute: 0.6 x10E3/uL (ref 0.1–0.9)
Monocytes: 8 %
Neutrophils Absolute: 4.8 x10E3/uL (ref 1.4–7.0)
Neutrophils: 62 %
Platelets: 377 x10E3/uL (ref 150–450)
RBC: 4.79 x10E6/uL (ref 3.77–5.28)
RDW: 13 % (ref 11.7–15.4)
WBC: 7.6 x10E3/uL (ref 3.4–10.8)

## 2024-06-27 LAB — CMP14+EGFR
ALT: 13 IU/L (ref 0–32)
AST: 15 IU/L (ref 0–40)
Albumin: 4.1 g/dL (ref 3.8–4.9)
Alkaline Phosphatase: 53 IU/L (ref 49–135)
BUN/Creatinine Ratio: 21 (ref 9–23)
BUN: 14 mg/dL (ref 6–24)
Bilirubin Total: 0.3 mg/dL (ref 0.0–1.2)
CO2: 24 mmol/L (ref 20–29)
Calcium: 9.6 mg/dL (ref 8.7–10.2)
Chloride: 102 mmol/L (ref 96–106)
Creatinine, Ser: 0.68 mg/dL (ref 0.57–1.00)
Globulin, Total: 3 g/dL (ref 1.5–4.5)
Glucose: 87 mg/dL (ref 70–99)
Potassium: 4.3 mmol/L (ref 3.5–5.2)
Sodium: 143 mmol/L (ref 134–144)
Total Protein: 7.1 g/dL (ref 6.0–8.5)
eGFR: 101 mL/min/1.73 (ref >59)

## 2024-06-27 LAB — IRON,TIBC AND FERRITIN PANEL
Ferritin: 109 ng/mL (ref 15–150)
Iron Saturation: 27 % (ref 15–55)
Iron: 74 ug/dL (ref 27–159)
Total Iron Binding Capacity: 279 ug/dL (ref 250–450)
UIBC: 205 ug/dL (ref 131–425)

## 2024-06-27 LAB — LIPID PANEL
Chol/HDL Ratio: 3.2 ratio (ref 0.0–4.4)
Cholesterol, Total: 212 mg/dL — ABNORMAL HIGH (ref 100–199)
HDL: 66 mg/dL (ref >39)
LDL Chol Calc (NIH): 133 mg/dL — ABNORMAL HIGH (ref 0–99)
Triglycerides: 72 mg/dL (ref 0–149)
VLDL Cholesterol Cal: 13 mg/dL (ref 5–40)

## 2024-06-27 LAB — ESTRADIOL: Estradiol: 11.9 pg/mL

## 2024-06-27 LAB — FSH/LH
FSH: 37.5 m[IU]/mL
LH: 26.6 m[IU]/mL

## 2024-06-27 LAB — PROGESTERONE: Progesterone: <0.1 ng/mL

## 2024-06-27 LAB — B12 AND FOLATE PANEL
Folate: 8.8 ng/mL (ref >3.0)
Vitamin B-12: 594 pg/mL (ref 232–1245)

## 2024-06-27 LAB — HGB A1C W/O EAG: Hgb A1c MFr Bld: 6.1 % — ABNORMAL HIGH (ref 4.8–5.6)

## 2024-06-29 ENCOUNTER — Encounter: Payer: Self-pay | Admitting: Nurse Practitioner

## 2024-07-30 ENCOUNTER — Ambulatory Visit: Payer: Self-pay | Admitting: Internal Medicine

## 2024-09-10 ENCOUNTER — Encounter: Payer: Self-pay | Admitting: Nurse Practitioner

## 2024-09-10 ENCOUNTER — Ambulatory Visit (INDEPENDENT_AMBULATORY_CARE_PROVIDER_SITE_OTHER): Admitting: Nurse Practitioner

## 2024-09-10 VITALS — BP 132/86 | HR 91 | Temp 96.3°F | Resp 16 | Ht 62.0 in | Wt 284.0 lb

## 2024-09-10 DIAGNOSIS — Z6841 Body Mass Index (BMI) 40.0 and over, adult: Secondary | ICD-10-CM

## 2024-09-10 DIAGNOSIS — J0100 Acute maxillary sinusitis, unspecified: Secondary | ICD-10-CM | POA: Diagnosis not present

## 2024-09-10 DIAGNOSIS — E661 Drug-induced obesity: Secondary | ICD-10-CM

## 2024-09-10 DIAGNOSIS — E66813 Obesity, class 3: Secondary | ICD-10-CM | POA: Insufficient documentation

## 2024-09-10 DIAGNOSIS — R051 Acute cough: Secondary | ICD-10-CM | POA: Diagnosis not present

## 2024-09-10 MED ORDER — AMOXICILLIN-POT CLAVULANATE 875-125 MG PO TABS: 1.0000 | ORAL_TABLET | Freq: Two times a day (BID) | ORAL | 0 refills | Status: AC

## 2024-09-10 MED ORDER — BENZONATATE 200 MG PO CAPS: 200.0000 mg | ORAL_CAPSULE | Freq: Three times a day (TID) | ORAL | 1 refills | Status: AC | PRN

## 2024-09-10 NOTE — Progress Notes (Signed)
 [facility] [address] [place], [place] [address]  Internal MEDICINE  Office Visit Note  Patient Name: Victoria Castaneda  [id number]  [id number]  Date of Service: 09/10/2024  Chief Complaint  Patient presents with   Acute Visit    Sinus infection     HPI Mikiya presents for an acute sick visit for sinus infection  --onset of symptoms was about 6 days ago.  --reports cough, ear pain, chills, nasal congestion, sinus pressure/pain, sinus drainage, green sputum, runny nose --denies fever, fatigue, headache,  body aches, chest tightness, SOB, and wheezing.  --negative for covid on home test.   --weight loss -- down 37 lbs since may, she has lost 6 more lbs since her last office visit. Great job! Continue the good work! Working with a telemed doc for obesity management.    Current Medication:  Outpatient Encounter Medications as of 09/10/2024  Medication Sig   amoxicillin -clavulanate (AUGMENTIN ) 875-125 MG tablet Take 1 tablet by mouth 2 (two) times daily for 10 days. May take with food.   benzonatate (TESSALON) 200 MG capsule Take 1 capsule (200 mg total) by mouth 3 (three) times daily as needed for cough.   MOUNJARO  12.5 MG/0.5ML Pen Inject 12.5 mg into the skin once a week.   albuterol  (VENTOLIN  HFA) 108 (90 Base) MCG/ACT inhaler Inhale 2 puffs into the lungs every 6 (six) hours as needed for wheezing or shortness of breath.   atorvastatin  (LIPITOR) 10 MG tablet Take 1 tablet (10 mg total) by mouth daily.   Blood Glucose Monitoring Suppl DEVI May substitute to any manufacturer covered by patient's insurance.for E11.65   CONTOUR NEXT TEST test strip Use 1 test strip to check glucose level once daily for diabetes dx code E11.65   fluticasone  (FLONASE) 50 MCG/ACT nasal spray Place into both nostrils daily.   furosemide  (LASIX ) 40 MG tablet TAKE 1 TABLET(40 MG) BY MOUTH DAILY   meloxicam  (MOBIC ) 15 MG tablet TAKE 1 TABLET(15 MG) BY MOUTH DAILY   Vitamin D , Ergocalciferol ,  (DRISDOL ) 1.25 MG (50000 UNIT) CAPS capsule Take 1 capsule (50,000 Units total) by mouth every 7 (seven) days.   WIXELA INHUB 250-50 MCG/ACT AEPB INHALE 1 PUFF INTO THE LUNGS TWICE DAILY IN THE MORNING AND AT BEDTIME   [DISCONTINUED] MOUNJARO  5 MG/0.5ML Pen Inject 5 mg into the skin once a week.   No facility-administered encounter medications on file as of 09/10/2024.      Medical History: Past Medical History:  Diagnosis Date   Arthritis    Asthma    Diverticulitis 06/2017   Headache    Hypersomnia, unspecified    Mild persistent asthma    Mixed hyperlipidemia    Motion sickness    Primary ovarian failure    Uncomplicated asthma    Vitamin D  deficiency, unspecified    Wheezing      Vital Signs: BP 132/86   Pulse 91   Temp (!) 96.3 F (35.7 C)   Resp 16   Ht 5' 2 (1.575 m)   Wt 284 lb (128.8 kg) Comment: at home  SpO2 95%   BMI 51.94 kg/m    Review of Systems  Constitutional:  Positive for chills. Negative for appetite change, fatigue and fever.  HENT:  Positive for congestion, ear pain, postnasal drip, rhinorrhea, sinus pressure and sinus pain. Negative for sore throat.   Respiratory:  Positive for cough. Negative for chest tightness, shortness of breath and wheezing.   Cardiovascular: Negative.  Negative for chest pain and  palpitations.  Gastrointestinal:  Negative for diarrhea, nausea and vomiting.  Musculoskeletal:  Negative for myalgias.  Neurological:  Negative for headaches.    Physical Exam Vitals reviewed.  Constitutional:      General: She is not in acute distress.    Appearance: Normal appearance. She is obese. She is ill-appearing.  HENT:     Head: Normocephalic and atraumatic.     Right Ear: Tympanic membrane, ear canal and external ear normal.     Left Ear: Tympanic membrane, ear canal and external ear normal.     Nose: Congestion and rhinorrhea present.     Mouth/Throat:     Mouth: Mucous membranes are moist.     Pharynx: Posterior  oropharyngeal erythema present.  Eyes:     Pupils: Pupils are equal, round, and reactive to light.  Cardiovascular:     Rate and Rhythm: Normal rate and regular rhythm.     Heart sounds: Normal heart sounds. No murmur heard. Pulmonary:     Effort: Pulmonary effort is normal. No respiratory distress.     Breath sounds: Normal breath sounds.  Lymphadenopathy:     Cervical: Cervical adenopathy present.  Skin:    General: Skin is warm and dry.     Capillary Refill: Capillary refill takes less than 2 seconds.  Neurological:     Mental Status: She is alert and oriented to person, place, and time.  Psychiatric:        Mood and Affect: Mood normal.        Behavior: Behavior normal.       Assessment/Plan: 1. Acute non-recurrent maxillary sinusitis (Primary) Augmentin  prescribed, take until gone - amoxicillin -clavulanate (AUGMENTIN ) 875-125 MG tablet; Take 1 tablet by mouth 2 (two) times daily for 10 days. May take with food.  Dispense: 20 tablet; Refill: 0  2. Acute cough May take OTC cough medication if desired for symptom management  3. Class 3 drug-induced obesity with serious comorbidity and body mass index (BMI) of 50.0 to 59.9 in adult [facility]) Patient is taking mounjaro  12.5 mg weekly per her obesity specialist Dr. Jacinta Pollack via telemedicine clinic [facility]. She is continuing to lose weight and is doing well.    General Counseling: amybeth sieg understanding of the findings of todays visit and agrees with plan of treatment. I have discussed any further diagnostic evaluation that may be needed or ordered today. We also reviewed her medications today. she has been encouraged to call the office with any questions or concerns that should arise related to todays visit.    Counseling:    No orders of the defined types were placed in this encounter.   Meds ordered this encounter  Medications   amoxicillin -clavulanate (AUGMENTIN ) 875-125 MG tablet     Sig: Take 1 tablet by mouth 2 (two) times daily for 10 days. May take with food.    Dispense:  20 tablet    Refill:  0    Fill new script today   benzonatate  (TESSALON ) 200 MG capsule    Sig: Take 1 capsule (200 mg total) by mouth 3 (three) times daily as needed for cough.    Dispense:  30 capsule    Refill:  1    Fill new script today    Return in about 1 month (around 10/11/2024) for reschedule 12/18 appt for later in january. .  [place] Controlled Substance Database was reviewed by me for overdose risk score (ORS)  Time spent:30 Minutes Time spent with patient included  reviewing progress notes, labs, imaging studies, and discussing plan for follow up.   This patient was seen by Mardy Maxin, FNP-C in collaboration with Dr. Sigrid Bathe as a part of collaborative care agreement.  Lala Been R. Maxin, MSN, FNP-C Internal Medicine

## 2024-09-25 ENCOUNTER — Ambulatory Visit: Admitting: Nurse Practitioner

## 2024-10-13 ENCOUNTER — Encounter: Payer: Self-pay | Admitting: Physician Assistant

## 2024-10-13 VITALS — BP 125/78 | HR 103 | Temp 98.0°F | Resp 16 | Ht 62.0 in | Wt 277.0 lb

## 2024-10-13 DIAGNOSIS — J069 Acute upper respiratory infection, unspecified: Secondary | ICD-10-CM | POA: Diagnosis not present

## 2024-10-13 DIAGNOSIS — R0602 Shortness of breath: Secondary | ICD-10-CM

## 2024-10-13 DIAGNOSIS — J4521 Mild intermittent asthma with (acute) exacerbation: Secondary | ICD-10-CM | POA: Diagnosis not present

## 2024-10-13 MED ORDER — HYDROCOD POLI-CHLORPHE POLI ER 10-8 MG/5ML PO SUER: 5.0000 mL | Freq: Two times a day (BID) | ORAL | 0 refills | Status: AC | PRN

## 2024-10-13 MED ORDER — AZITHROMYCIN 250 MG PO TABS: ORAL_TABLET | ORAL | 0 refills | Status: AC

## 2024-10-13 MED ORDER — IPRATROPIUM-ALBUTEROL 0.5-2.5 (3) MG/3ML IN SOLN
3.0000 mL | Freq: Once | RESPIRATORY_TRACT | Status: AC
  Administered 2024-10-13: 3 mL via RESPIRATORY_TRACT

## 2024-10-13 NOTE — Progress Notes (Signed)
 " [facility] [address] [place], [place] [address]  Internal MEDICINE  Office Visit Note  Patient Name: Victoria Castaneda  [id number]  [id number]  Date of Service: 10/13/2024  Chief Complaint  Patient presents with   Acute Visit   Shortness of Breath    Wheezing since Friday, hurts to cough. All symptoms began Friday.     HPI Pt is here for a sick visit. -symptoms started on Friday, low grade fever with chills, cough, a little sinus congestion, but mainly chest. Nothing coming up when she is coughing. Some wheezing and SOB. Did cough hard enough to vomit once, but not nauseous -has tried mucinex, alka seltzer cold -no sick contacts -Uses albuterol  at home only as needed normally, but has been using regularly since being sick -covid test negative  Current Medication:  Outpatient Encounter Medications as of 10/13/2024  Medication Sig   albuterol  (VENTOLIN  HFA) 108 (90 Base) MCG/ACT inhaler Inhale 2 puffs into the lungs every 6 (six) hours as needed for wheezing or shortness of breath.   atorvastatin  (LIPITOR) 10 MG tablet Take 1 tablet (10 mg total) by mouth daily.   azithromycin  (ZITHROMAX ) 250 MG tablet Take one tab a day for 10 days for uri   benzonatate  (TESSALON ) 200 MG capsule Take 1 capsule (200 mg total) by mouth 3 (three) times daily as needed for cough.   Blood Glucose Monitoring Suppl DEVI May substitute to any manufacturer covered by patient's insurance.for E11.65   chlorpheniramine-HYDROcodone  (TUSSIONEX) 10-8 MG/5ML Take 5 mLs by mouth every 12 (twelve) hours as needed for cough.   CONTOUR NEXT TEST test strip Use 1 test strip to check glucose level once daily for diabetes dx code E11.65   fluticasone  (FLONASE) 50 MCG/ACT nasal spray Place into both nostrils daily.   furosemide  (LASIX ) 40 MG tablet TAKE 1 TABLET(40 MG) BY MOUTH DAILY   meloxicam  (MOBIC ) 15 MG tablet TAKE 1 TABLET(15 MG) BY MOUTH DAILY   MOUNJARO  12.5 MG/0.5ML Pen Inject 12.5 mg into the skin  once a week.   Vitamin D , Ergocalciferol , (DRISDOL ) 1.25 MG (50000 UNIT) CAPS capsule Take 1 capsule (50,000 Units total) by mouth every 7 (seven) days.   WIXELA INHUB 250-50 MCG/ACT AEPB INHALE 1 PUFF INTO THE LUNGS TWICE DAILY IN THE MORNING AND AT BEDTIME   Facility-Administered Encounter Medications as of 10/13/2024  Medication   ipratropium-albuterol  (DUONEB) 0.5-2.5 (3) MG/3ML nebulizer solution 3 mL      Medical History: Past Medical History:  Diagnosis Date   Arthritis    Asthma    Diverticulitis 06/2017   Headache    Hypersomnia, unspecified    Mild persistent asthma    Mixed hyperlipidemia    Motion sickness    Primary ovarian failure    Uncomplicated asthma    Vitamin D  deficiency, unspecified    Wheezing      Vital Signs: BP 125/78   Pulse (!) 103   Temp 98 F (36.7 C)   Resp 16   Ht 5' 2 (1.575 m)   Wt 277 lb (125.6 kg)   SpO2 94%   BMI 50.66 kg/m    Review of Systems  Constitutional:  Negative for fatigue and fever.  HENT:  Positive for congestion and postnasal drip. Negative for mouth sores.   Respiratory:  Positive for cough, chest tightness, shortness of breath and wheezing.   Cardiovascular:  Negative for chest pain.  Gastrointestinal:  Negative for diarrhea and nausea.  Genitourinary:  Negative for flank pain.  Psychiatric/Behavioral: Negative.      Physical Exam Vitals and nursing note reviewed.  Constitutional:      General: She is not in acute distress.    Appearance: She is ill-appearing.  HENT:     Head: Normocephalic and atraumatic.  Cardiovascular:     Rate and Rhythm: Normal rate and regular rhythm.  Pulmonary:     Breath sounds: Wheezing present.     Comments: Wheezing throughout all lung fields, coughing upon deep breathing Skin:    General: Skin is warm and dry.  Neurological:     Mental Status: She is alert.  Psychiatric:        Behavior: Behavior normal.       Assessment/Plan: 1. Upper respiratory tract  infection, unspecified type (Primary) Given duoneb in office and will send zpak and tussionex. Cautioned on S/E of tussionex including grogginess. Call if new or worsening symptoms and may consider CXR - azithromycin  (ZITHROMAX ) 250 MG tablet; Take one tab a day for 10 days for uri  Dispense: 10 tablet; Refill: 0 - chlorpheniramine-HYDROcodone  (TUSSIONEX) 10-8 MG/5ML; Take 5 mLs by mouth every 12 (twelve) hours as needed for cough.  Dispense: 70 mL; Refill: 0  2. Mild intermittent asthma with exacerbation Given duoneb in office, albuterol  as needed at home. Will treat with zpak. Pt declined prednisone  but may call if not improving  3. SOB (shortness of breath) Call if not improving, consider prednisone  and/or Chest xray - ipratropium-albuterol  (DUONEB) 0.5-2.5 (3) MG/3ML nebulizer solution 3 mL   General Counseling: Victoria Castaneda verbalizes understanding of the findings of todays visit and agrees with plan of treatment. I have discussed any further diagnostic evaluation that may be needed or ordered today. We also reviewed her medications today. she has been encouraged to call the office with any questions or concerns that should arise related to todays visit.    Counseling:    No orders of the defined types were placed in this encounter.   Meds ordered this encounter  Medications   azithromycin  (ZITHROMAX ) 250 MG tablet    Sig: Take one tab a day for 10 days for uri    Dispense:  10 tablet    Refill:  0   chlorpheniramine-HYDROcodone  (TUSSIONEX) 10-8 MG/5ML    Sig: Take 5 mLs by mouth every 12 (twelve) hours as needed for cough.    Dispense:  70 mL    Refill:  0   ipratropium-albuterol  (DUONEB) 0.5-2.5 (3) MG/3ML nebulizer solution 3 mL    Time spent:35 Minutes "

## 2024-10-15 ENCOUNTER — Ambulatory Visit: Payer: Self-pay | Admitting: Family Medicine

## 2024-10-15 ENCOUNTER — Ambulatory Visit
Admission: EM | Admit: 2024-10-15 | Discharge: 2024-10-15 | Disposition: A | Discharge status: Home / Self Care | Attending: Family Medicine | Admitting: Family Medicine

## 2024-10-15 ENCOUNTER — Ambulatory Visit

## 2024-10-15 ENCOUNTER — Encounter: Payer: Self-pay | Admitting: Emergency Medicine

## 2024-10-15 DIAGNOSIS — J4541 Moderate persistent asthma with (acute) exacerbation: Secondary | ICD-10-CM

## 2024-10-15 DIAGNOSIS — R0789 Other chest pain: Secondary | ICD-10-CM | POA: Diagnosis not present

## 2024-10-15 MED ORDER — DEXAMETHASONE SOD PHOSPHATE PF 10 MG/ML IJ SOLN
10.0000 mg | Freq: Once | INTRAMUSCULAR | Status: AC
  Administered 2024-10-15: 10 mg via INTRAMUSCULAR

## 2024-10-15 MED ORDER — ALBUTEROL SULFATE (2.5 MG/3ML) 0.083% IN NEBU
2.5000 mg | INHALATION_SOLUTION | RESPIRATORY_TRACT | Status: AC | PRN
  Administered 2024-10-15 (×2): 2.5 mg via RESPIRATORY_TRACT

## 2024-10-15 MED ORDER — PREDNISONE 10 MG (21) PO TBPK: ORAL_TABLET | Freq: Every day | ORAL | 0 refills | Status: AC

## 2024-10-15 NOTE — ED Provider Notes (Signed)
 " [facility]    CSN: [id number] Arrival date & time: 10/15/24  9160      History   Chief Complaint Chief Complaint  Patient presents with   Back Pain   Chest Pain    HPI Victoria Castaneda is a 59 y.o. female.   HPI  History obtained from the patient. Victoria Castaneda presents for chest pain and upper back pain.  She has been gassy. Tried Tums and eating baking soda without relief. Denies acid reflux or burning sensation.   Pain described as tightness and radiates from the top of her back and front of her chest.  Pain started 2 nights ago but has been sick for the past 5 days.  She has been taking antibiotics and using a cough medication that were prescribed by her PCP 2 days ago.  She has asthma. She has been using her maintenance and rescue inhalers. Notes she has been wheezing a lot while occasionally feeling short of breath.  Denies vaping and smoker.         Past Medical History:  Diagnosis Date   Arthritis    Asthma    Diverticulitis 06/2017   Headache    Hypersomnia, unspecified    Mild persistent asthma    Mixed hyperlipidemia    Motion sickness    Primary ovarian failure    Uncomplicated asthma    Vitamin D  deficiency, unspecified    Wheezing     Patient Active Problem List   Diagnosis Date Noted   Class 3 drug-induced obesity with serious comorbidity and body mass index (BMI) of 50.0 to 59.9 in adult [facility]) 09/10/2024   Acquired absence of both cervix and uterus 06/26/2024   Prediabetes 06/26/2024   Acute recurrent pansinusitis 03/22/2020   Encounter for screening mammogram for malignant neoplasm of breast 03/22/2020   Vaginal yeast infection 09/21/2019   Diverticulitis of large intestine without perforation or abscess without bleeding 08/17/2019   Generalized abdominal pain 08/17/2019   Primary osteoarthritis of right shoulder 03/22/2019   Mild intermittent asthma without complication 03/22/2019   Acute upper respiratory infection 09/19/2018   Wheezing  09/19/2018   Cough 09/19/2018   Bilateral impacted cerumen 06/10/2018   Morbid obesity (HCC) 06/10/2018   Candidiasis 02/13/2018   Dysuria 02/13/2018   Pain in left knee 12/26/2017   Body mass index 50.0-59.9, adult (HCC) 10/17/2017   Hypersomnia, unspecified 10/17/2017   Otalgia, bilateral 10/17/2017   Right leg pain 10/17/2017   Essential hypertension 10/17/2017   Shortness of breath 10/17/2017   Mild persistent asthma with exacerbation 10/17/2017   Mixed hyperlipidemia 10/17/2017   Melena 10/17/2017   Other specified menopausal and perimenopausal disorders 10/17/2017   Low back pain 10/17/2017   Vitamin D  deficiency, unspecified 10/17/2017   Urinary tract infection without hematuria 10/17/2017   Gastroesophageal reflux disease 10/17/2017   Mild persistent asthma, uncomplicated 10/17/2017   Other primary ovarian failure 10/17/2017   Encounter for general adult medical examination with abnormal findings     Past Surgical History:  Procedure Laterality Date   ABDOMINAL HYSTERECTOMY     CHOLECYSTECTOMY     COLONOSCOPY WITH PROPOFOL  N/A 12/10/2015   Procedure: COLONOSCOPY WITH PROPOFOL ;  Surgeon: Rogelia Copping, MD;  Location: [facility];  Service: Endoscopy;  Laterality: N/A;    OB History   No obstetric history on file.      Home Medications    Prior to Admission medications  Medication Sig Start Date End Date Taking? Authorizing Provider  predniSONE  [facility]  UNI-PAK 21 TAB) 10 MG (21) TBPK tablet Take by mouth daily. Take 6 tabs by mouth daily for 1, then 5 tabs for 1 day, then 4 tabs for 1 day, then 3 tabs for 1 day, then 2 tabs for 1 day, then 1 tab for 1 day. 10/15/24  Yes Xiomara Sevillano, DO  albuterol  (VENTOLIN  HFA) 108 (90 Base) MCG/ACT inhaler Inhale 2 puffs into the lungs every 6 (six) hours as needed for wheezing or shortness of breath. 12/19/23   Liana Fish, NP  atorvastatin  (LIPITOR) 10 MG tablet Take 1 tablet (10 mg total) by mouth daily. 06/26/24    Liana Fish, NP  azithromycin  (ZITHROMAX ) 250 MG tablet Take one tab a day for 10 days for uri 10/13/24   McDonough, Tinnie POUR, PA-C  benzonatate  (TESSALON ) 200 MG capsule Take 1 capsule (200 mg total) by mouth 3 (three) times daily as needed for cough. 09/10/24   Abernathy, Alyssa, NP  Blood Glucose Monitoring Suppl DEVI May substitute to any manufacturer covered by patient's insurance.for E11.65 08/28/23   Khan, Fozia M, MD  chlorpheniramine-HYDROcodone  (TUSSIONEX) 10-8 MG/5ML Take 5 mLs by mouth every 12 (twelve) hours as needed for cough. 10/13/24   McDonough, Tinnie POUR, PA-C  CONTOUR NEXT TEST test strip Use 1 test strip to check glucose level once daily for diabetes dx code E11.65 09/04/23   Liana Fish, NP  fluticasone  (FLONASE) 50 MCG/ACT nasal spray Place into both nostrils daily.    [provider]  furosemide  (LASIX ) 40 MG tablet TAKE 1 TABLET(40 MG) BY MOUTH DAILY 06/13/24   Abernathy, Fish, NP  meloxicam  (MOBIC ) 15 MG tablet TAKE 1 TABLET(15 MG) BY MOUTH DAILY 06/16/24   Liana Fish, NP  MOUNJARO  12.5 MG/0.5ML Pen Inject 12.5 mg into the skin once a week. 09/01/24   [provider]  Vitamin D , Ergocalciferol , (DRISDOL ) 1.25 MG (50000 UNIT) CAPS capsule Take 1 capsule (50,000 Units total) by mouth every 7 (seven) days. 06/26/24   Liana Fish, NP  WIXELA INHUB 250-50 MCG/ACT AEPB INHALE 1 PUFF INTO THE LUNGS TWICE DAILY IN THE MORNING AND AT BEDTIME 04/28/24   McDonough, Tinnie POUR, PA-C    Family History Family History  Problem Relation Age of Onset   Diabetes Mother    Breast cancer Paternal Aunt     Social History Social History[1]   Allergies   Patient has no known allergies.   Review of Systems Review of Systems: negative unless otherwise stated in HPI.      Physical Exam Triage Vital Signs ED Triage Vitals  Encounter Vitals Group     BP 10/15/24 0901 (!) 157/90     Girls Systolic BP Percentile --      Girls Diastolic BP Percentile  --      Boys Systolic BP Percentile --      Boys Diastolic BP Percentile --      Pulse Rate 10/15/24 0901 84     Resp 10/15/24 0901 18     Temp 10/15/24 0901 98.3 F (36.8 C)     Temp Source 10/15/24 0901 Oral     SpO2 10/15/24 0901 95 %     Weight 10/15/24 0857 289 lb (131.1 kg)     Height --      Head Circumference --      Peak Flow --      Pain Score 10/15/24 0859 4     Pain Loc --      Pain Education --  Exclude from Growth Chart --    No data found.  Updated Vital Signs BP (!) 157/90   Pulse 84   Temp 98.3 F (36.8 C) (Oral)   Resp 18   Wt 131.1 kg   SpO2 95%   BMI 52.86 kg/m   Visual Acuity Right Eye Distance:   Left Eye Distance:   Bilateral Distance:    Right Eye Near:   Left Eye Near:    Bilateral Near:     Physical Exam GEN:     alert, non-toxic appearing female in no distress    HENT:  mucus membranes moist, no nasal discharge EYES:   cfno scleral injection or discharge RESP:  no increased work of breathing, poor air movement bilaterally, and wheezing throughout CVS:   regular rate and rhythm Skin:   warm and dry, no rash on visible skin    UC Treatments / Results  Labs (all labs ordered are listed, but only abnormal results are displayed) Labs Reviewed - No data to display  EKG   Radiology DG Chest 2 View Result Date: 10/15/2024 CLINICAL DATA:  Chest pain EXAM: CHEST - 2 VIEW COMPARISON:  Feb 19, 2024 FINDINGS: The heart size and mediastinal contours are within normal limits. Both lungs are clear. The visualized skeletal structures are unremarkable. IMPRESSION: No active cardiopulmonary disease. Electronically Signed   By: Lynwood Landy Raddle M.D.   On: 10/15/2024 10:12     Procedures Procedures (including critical care time)  Medications Ordered in UC Medications  albuterol  (PROVENTIL ) (2.5 MG/3ML) 0.083% nebulizer solution 2.5 mg (2.5 mg Nebulization Given 10/15/24 0931)  dexamethasone  (DECADRON ) injection 10 mg (10 mg Intramuscular  Given 10/15/24 1006)    Initial Impression / Assessment and Plan / UC Course  I have reviewed the triage vital signs and the nursing notes.  Pertinent labs & imaging results that were available during my care of the patient were reviewed by me and considered in my medical decision making (see chart for details).       Pt is a 59 y.o. female who presents for ongoing cough for the past 5 days in the setting of chest pain and upper back pain. Lesslie is afebrile here without recent antipyretics.  She is hypertensive here at 157/90.  Satting adequately on room air. Overall pt is non-toxic appearing, well hydrated, without respiratory distress. Pulmonary exam is significant for poor air movement bilaterally with diffuse wheezing.  Albuterol  nebulizer x 2 provided.  EKG obtained showing normal sinus rhythm without acute ST or T wave changes, normal PR interval, compared to 08/15/2022 EKG unchanged.  Recommended chest x-ray to evaluate for possible pneumonia patient is agreeable.  Chest xray personally reviewed by me without focal pneumonia, pleural effusion, cardiomegaly or pneumothorax.  Viral testing deferred due to duration of symptoms.   Continue azithromycin  and Hycodan cough syrup as previously prescribed by her primary care provider.  Discussed symptomatic treatment. Explained lack of efficacy of antibiotics in viral disease.  And 10 mg IM Decadron  for asthma exacerbation.  Start home prednisone  taper tomorrow.  Air movement has increased after 2 albuterol  nebulizers here.  Typical duration of symptoms discussed.   Return and ED precautions given and voiced understanding. Discussed MDM, treatment plan and plan for follow-up with patient who agrees with plan.     Final Clinical Impressions(s) / UC Diagnoses   Final diagnoses:  Chest tightness  Moderate persistent asthma with acute exacerbation     Discharge Instructions  Your chest xray was concerning for possible pneumonia though  the radiologist has not yet read it. If they call the area pneumonia, I will call you.  You will see your results in MyChart.   Continue your Azithromycin  that was prescribed by your primary care provider.  Start the prednisone  tomorrow as you were given a shot of steroids today.        ED Prescriptions     Medication Sig Dispense Auth. Provider   predniSONE  (STERAPRED UNI-PAK 21 TAB) 10 MG (21) TBPK tablet Take by mouth daily. Take 6 tabs by mouth daily for 1, then 5 tabs for 1 day, then 4 tabs for 1 day, then 3 tabs for 1 day, then 2 tabs for 1 day, then 1 tab for 1 day. 21 tablet Teira Arcilla, DO      PDMP not reviewed this encounter.     [1]  Social History Tobacco Use   Smoking status: Never   Smokeless tobacco: Never  Vaping Use   Vaping status: Never Used  Substance Use Topics   Alcohol use: Yes    Comment: occasionally   Drug use: No     Danyl Deems, DO 10/15/24 1111  "

## 2024-10-15 NOTE — ED Triage Notes (Signed)
 For the past 2 days pt c/o upper back and chest pain. She was seen by her PCP and 2 days ago prescribed abx and a cough syrup.

## 2024-10-15 NOTE — Discharge Instructions (Signed)
 Your chest xray was concerning for possible pneumonia though the radiologist has not yet read it. If they call the area pneumonia, I will call you.  You will see your results in MyChart.   Continue your Azithromycin  that was prescribed by your primary care provider.  Start the prednisone  tomorrow as you were given a shot of steroids today.

## 2024-10-16 ENCOUNTER — Other Ambulatory Visit: Payer: Self-pay | Admitting: Physician Assistant

## 2024-10-16 DIAGNOSIS — J4530 Mild persistent asthma, uncomplicated: Secondary | ICD-10-CM

## 2024-11-18 ENCOUNTER — Ambulatory Visit: Admitting: Nurse Practitioner

## 2025-06-29 ENCOUNTER — Encounter: Admitting: Nurse Practitioner
